# Patient Record
Sex: Female | Born: 1985 | Race: White | Hispanic: No | Marital: Married | Smoking: Former smoker
Health system: Southern US, Community
[De-identification: ages and names within clinical notes are randomized; demographics above are authoritative.]

## PROBLEM LIST (undated history)

## (undated) DIAGNOSIS — R519 Headache, unspecified: Secondary | ICD-10-CM

## (undated) DIAGNOSIS — K900 Celiac disease: Secondary | ICD-10-CM

## (undated) HISTORY — PX: NO PAST SURGERIES: SHX2092

## (undated) HISTORY — DX: Headache, unspecified: R51.9

## (undated) HISTORY — DX: Celiac disease: K90.0

---

## 2020-09-08 ENCOUNTER — Other Ambulatory Visit: Payer: Self-pay | Admitting: Otolaryngology

## 2020-09-08 DIAGNOSIS — J329 Chronic sinusitis, unspecified: Secondary | ICD-10-CM
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
## 2020-11-18 ENCOUNTER — Encounter: Payer: Self-pay | Admitting: Psychiatry

## 2020-11-18 ENCOUNTER — Ambulatory Visit: Payer: BC Managed Care – PPO | Admitting: Psychiatry

## 2020-11-18 VITALS — BP 105/79 | HR 90 | Ht 66.0 in | Wt 130.0 lb

## 2020-11-18 DIAGNOSIS — G08 Intracranial and intraspinal phlebitis and thrombophlebitis: Secondary | ICD-10-CM

## 2020-11-18 MED ORDER — INDOMETHACIN 25 MG PO CAPS: ORAL_CAPSULE | ORAL | 0 refills | Status: AC

## 2020-11-18 NOTE — Patient Instructions (Addendum)
-  MRI/MRA/MRV of brain  -Start Indomethacin for 15 days Week  1:  -Please obtain generic omeprazole (20 mg) [[employer], [employer], [employer], etc] and start one daily. You may increase to twice a day if necessary. -Please start indomethacin (indocin) 25 mg (one capsule) 3 times per day with meals. -If, in one week, you are headache-free, this is your dose, stay on it. -If, in one week, you are tolerating the indocin, but have headache, then increase the dose as below. Week 2: -Please increase indocin to 50 mg (2 capsules) 3 times per day with meals. -If, in one week, you are headache-free, this is your dose, stay on it. -If, in one week, you are tolerating the indocin, but have headache, then increase the dose as below. Week 3:  -Please increase indocin to 75 mg (3 capsules) 3 times per day with meals. -If, in one week, you are headache-free, this is your dose, stay on it. -If, in one week, you have partial relief, call or see her neurologist. -If, in one week, you have no relief, stop the indocin.  Please do not take naproxen while taking indomethacin  Let me know if headaches do not improve after the 15 days of medication

## 2020-11-25 ENCOUNTER — Telehealth: Payer: Self-pay | Admitting: Psychiatry

## 2020-11-25 NOTE — Telephone Encounter (Signed)
The MRV Head & MRA Head is the same CPT code, I will not be able to get both approved. Which one do you prefer her to have?

## 2020-11-25 NOTE — Telephone Encounter (Signed)
aI checked her insurance [employer] and no Berkley Harvey is require, she is able to have all three of the images.  [employer] auth: [employer] spoke to Trish A ref # [id number].

## 2020-11-25 NOTE — Telephone Encounter (Signed)
Let's do the MRA. Thanks!

## 2020-12-05 ENCOUNTER — Ambulatory Visit (HOSPITAL_COMMUNITY)
Admission: RE | Admit: 2020-12-05 | Discharge: 2020-12-05 | Disposition: A | Discharge status: Home / Self Care | Payer: BC Managed Care – PPO | Source: Ambulatory Visit | Attending: Psychiatry | Admitting: Psychiatry

## 2020-12-05 ENCOUNTER — Other Ambulatory Visit: Payer: Self-pay

## 2020-12-05 DIAGNOSIS — G08 Intracranial and intraspinal phlebitis and thrombophlebitis: Secondary | ICD-10-CM | POA: Diagnosis not present
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
## 2020-12-05 MED ORDER — GADOBUTROL 1 MMOL/ML IV SOLN
5.9000 mL | Freq: Once | INTRAVENOUS | Status: AC | PRN
  Administered 2020-12-05: 5.9 mL via INTRAVENOUS

## 2020-12-17 ENCOUNTER — Encounter: Payer: Self-pay | Admitting: Psychiatry

## 2020-12-17 ENCOUNTER — Ambulatory Visit: Payer: BC Managed Care – PPO | Admitting: Psychiatry

## 2020-12-17 VITALS — BP 117/76 | HR 95 | Ht 66.0 in | Wt 131.0 lb

## 2020-12-17 DIAGNOSIS — G43709 Chronic migraine without aura, not intractable, without status migrainosus: Secondary | ICD-10-CM

## 2020-12-17 DIAGNOSIS — M5481 Occipital neuralgia: Secondary | ICD-10-CM | POA: Diagnosis not present

## 2020-12-17 MED ORDER — CYPROHEPTADINE HCL 4 MG PO TABS: 4.0000 mg | ORAL_TABLET | Freq: Every day | ORAL | 2 refills | Status: DC

## 2020-12-17 NOTE — Patient Instructions (Addendum)
Occipital neuralgia and migraine  Start cyproheptadine 4 mg nightly at bedtime to prevent headaches. It can take 6-8 weeks for the medication to build in the system to feel its full effect.  MIGRAINE AND PREGNANCY  Migraine is a complex neurological disorder with many triggers, including sex hormones! This means that some women may experience changes in headache frequency when they are pregnant. In general, the increasing levels of estrogen during pregnancy can be protective, and women may see an improvement in migraine in the 2nd and 3rd trimester. After delivery, women typically return to the pre-pregnancy migraine pattern.   Treatment of migraine in pregnancy is tricky because the list of SAFE medications in pregnancy does not necessarily match the list of EFFECTIVE medications in headache.  The most difficult time is when a woman is planning pregnancy and it is unknown whether she is actually pregnant. Unfortunately, to avoid fetal exposure to potentially harmful medications, it is necessary to stop most migraine preventive medications and avoid the use of typical prescribed and over-the-counter as-needed medications previously used for headache.   ACUTE Treatment of Migraine in Pregnancy - Usual abortive treatments such as triptans and NSAIDS are Category C in pregnancy.  - Acetaminophen (Tylenol) is less commonly used for migraine, however, it is considered the safest analgesic to use in pregnancy (category B), and it can be effective if used early in a migraine, especially in conjunction with caffeine and metoclopramide (reglan), an anti-nausea medication.   PROPHYLACTIC Treatment of Migraine in Pregnancy - Usual prophylactic migraine treatments are not safe to take in pregnancy - Magnesium oxide in doses up to 400mg  per day is often considered first line in pregnancy as a migraine preventative (level B evidence of efficacy) and is thought to be safe. IV Magnesium has been linked to bone  demineralization in the fetus - Cyproheptadine (brand name Periactin) is an older anti-histamine that can be used (max dose 8mg /day) in prophylaxis and has not been shown to have adverse effects to the fetus. Side effects include fatigue and weight gain. This should not be used while breastfeeding.  - Memantine (brand name Namenda) is one of the few preventives not found to cause harm in developing fetuses. It is a drug typically used to treat dementia, but it has also been found to be helpful in some studies of migraine prevention. It works by blocking glutamate, a chemical associated with increased migraine pain and frequency.   Non-pharmacological Methods of Treatment - these are the safest interventions and can significantly redcue the frequency and severity of migraine - Nerve blocks (lidocaine only unless after 20 weeks) - Physical Therapy - Acupuncture - Behavioral Therapies  - Relaxation strategies, stress management, biofeedback, yoga  - Identification of triggers: The major migraine triggers include stress, hormones, diet (missed meals more than specific food triggers), weather, sleep changes, odors, neck pain, lighting/glare, neck pain, exercise/exertion, caffeine intake and dehydration. - Lifestyle changes: eating regular healthy meals, adequate sleep, staying well-hydrated with water (64 oz/day), and at least 30 min of exercise (walking is fine) per day

## 2020-12-17 NOTE — Progress Notes (Signed)
   CC:  Headaches  Follow-up Visit  Last visit: 11/18/20  35 year old female who follows in clinic for daily right sided headaches which began in May 2022. At her last visit she was started on Indomethacin trial and MRI/MRA/MRV was ordered.  Interval History: She tried Indomethacin which did improve her headache frequency but did not resolve the headache. Headache is no longer constant but she continues to get them 3-4 times per week. Sometimes they are severe enough to cause throbbing pain and photophobia. Using a heating pad on her neck  helps reduce the pain.  MRI/MRA/MRV were unremarkable.  She has started trying to become pregnant now that she has stopped Indomethacin.  Endorses crawling sensation in her legs at night. States her iron level was checked earlier this year and was normal.  Headache days per month: 12 Headache free days per month: 18  Current Headache Regimen: Preventative: none Abortive: Tylenol   Prior Therapies                                  Indomethacin 75 mg TID Topamax Excedrin Fioricet   KP review: HEADACHE SCORES:               Physical Exam:   Vital Signs: BP 117/76   Pulse 95   Ht 5\' 6"  (1.676 m)   Wt 131 lb (59.4 kg)   BMI 21.14 kg/m  GENERAL:  well appearing, in no acute distress, alert  HEAD:  Normocephalic/atraumatic. RESP: normal respiratory effort MUSK: +tenderness to palpation over right occiput  NEUROLOGICAL: Mental Status: Alert, oriented to person, place and time, Follows commands, and Speech fluent and appropriate. Cranial Nerves: PERRL, face symmetric, no dysarthria, hearing grossly intact Motor: moves all extremities equally Gait: normal-based.  IMPRESSION: 35 year old female who presents for follow up of right sided headaches. Imaging was normal and headaches did not improve with indomethacin. Her headaches pattern is most consistent with migraine and right occipital neuralgia. Discussed treatment options for  migraine in pregnancy including supplements, medications, and procedures. Will start cyproheptadine 4 mg QHS for headache prevention. Offered antiemetic for rescue, but she would prefer to use Tylenol as monotherapy at this time.  PLAN: -Preventive: Start cyproheptadine 4 mg QHS -Rescue: Tylenol PRN -Next steps: consider occipital nerve block, anti-emetic  Headache education was done. Discussed lifestyle modification including increased oral hydration, decreased caffeine, exercise and stress management. Discussed treatment options including preventive and acute medications, and natural supplements. Discussed medication overuse headache and to limit use of acute treatments to no more than 2 days/week or 10 days/month. Discussed medication side effects, adverse reactions and drug interactions. Written educational materials and patient instructions outlining all of the above were given.   Follow-up: 3 months  I spent a total of 36 minutes on the date of the service.  My impression and recommendations were discussed at length with the patient.  The patient and family voiced understanding to my recommendations.  All questions were answered. The patient was provided with a detailed after visit summary highlighting my impression and recommendations.   34, MD 12/17/20 2:34 PM

## 2021-02-25 ENCOUNTER — Other Ambulatory Visit: Payer: Self-pay | Admitting: Psychiatry

## 2021-02-28 NOTE — L&D Delivery Note (Signed)
Delivery Note At 7:06 PM a viable female was delivered via Vaginal, Spontaneous (Presentation: Left Occiput Anterior).  APGAR: 9, 9; weight  .   Placenta status: Spontaneous, Intact.  Cord: 3 vessels with the following complications: None.  Anesthesia: Epidural Episiotomy: None Lacerations: 3rd degree, 3A, RV exam confirms Suture Repair: 2.0 3.0 vicryl rapide Est. Blood Loss (mL): 240 cc  Mom to postpartum.  Baby to Couplet care / Skin to Skin.  Lyn Henri 10/25/2021, 7:40 PM

## 2021-03-19 LAB — OB RESULTS CONSOLE RUBELLA ANTIBODY, IGM: Rubella: IMMUNE

## 2021-03-19 LAB — OB RESULTS CONSOLE HEPATITIS B SURFACE ANTIGEN: Hepatitis B Surface Ag: NEGATIVE

## 2021-03-19 LAB — OB RESULTS CONSOLE RPR: RPR: NONREACTIVE

## 2021-03-19 LAB — OB RESULTS CONSOLE HIV ANTIBODY (ROUTINE TESTING): HIV: NONREACTIVE

## 2021-03-22 ENCOUNTER — Ambulatory Visit: Payer: BC Managed Care – PPO | Admitting: Psychiatry

## 2021-03-22 ENCOUNTER — Other Ambulatory Visit: Payer: Self-pay

## 2021-03-22 ENCOUNTER — Encounter: Payer: Self-pay | Admitting: Psychiatry

## 2021-03-22 VITALS — BP 105/62 | HR 83 | Ht 66.0 in | Wt 151.0 lb

## 2021-03-22 DIAGNOSIS — G43009 Migraine without aura, not intractable, without status migrainosus: Secondary | ICD-10-CM | POA: Diagnosis not present

## 2021-03-22 NOTE — Progress Notes (Signed)
° °  CC:  headaches  Follow-up Visit  Last visit: 12/17/20  Brief HPI: 36 year old female who follows in clinic for daily right sided headaches which began in May 2022. MRI/MRA/MRV were unremarkable.  Interval History: Since her last visit she has become pregnant. Found out she was pregnant around Christmas.   Headaches have significantly improved since starting cyproheptadine. She will only have headaches if she misses a dose. Has not had to use Tylenol since starting it. Sleep has also improved. Notes she has gained a few pounds, but is not sure if this related to the medication, quitting smoking, or becoming pregnant.   Headache days per month: 0 Headache free days per month: 30  Current Headache Regimen: Preventative: cyproheptadine 4 mg QHS Abortive: Tylenol   Prior Therapies                                  Indomethacin 75 mg TID Topamax Excedrin Fioricet Cyproheptadine 4 mg QHS  Physical Exam:   Vital Signs: BP 105/62    Pulse 83    Ht 5\' 6"  (1.676 m)    Wt 151 lb (68.5 kg)    BMI 24.37 kg/m  GENERAL:  well appearing, in no acute distress, alert  SKIN:  Color, texture, turgor normal. No rashes or lesions HEAD:  Normocephalic/atraumatic. RESP: normal respiratory effort MSK:  No gross joint deformities.   NEUROLOGICAL: Mental Status: Alert, oriented to person, place and time, Follows commands, and Speech fluent and appropriate. Cranial Nerves: PERRL, face symmetric, no dysarthria, hearing grossly intact Motor: moves all extremities equally Gait: normal-based.  IMPRESSION: 36 year old pregnant female who presents for follow up of headaches. Her headaches have essentially resolved since starting cyproheptadine. Counseled that she will need to avoid breastfeeding with this medication, which she is agreeable to. Discussed that weight gain can be a side effects of cyproheptadine, though this is confounded by her quitting smoking and becoming pregnant around the same  time she started the medication. Will continue current medication regimen for now. She will monitor her weight for significant changes.  PLAN: -Preventive: Continue cyproheptadine 4 mg QHS -Rescue: Tylenol PRN -next steps: consider occipital nerve block, anti-emetic   Follow-up: 6 months  I spent a total of 17 minutes on the date of the service. Discussed treatment options including preventive and acute medications, nerve blocks, and physical therapy.  Discussed medication side effects, adverse reactions and drug interactions. Written educational materials and patient instructions outlining all of the above were given.  34, MD 03/22/21 2:53 PM

## 2021-04-07 LAB — OB RESULTS CONSOLE GC/CHLAMYDIA
Chlamydia: NEGATIVE
Neisseria Gonorrhea: NEGATIVE

## 2021-09-16 NOTE — Progress Notes (Signed)
   CC:  headaches  Follow-up Visit  Last visit: 03/22/21  Brief HPI: 36 year old female who follows in clinic for daily right sided headaches which began in May 2022. MRI/MRA/MRV were unremarkable.  At her last visit she was continued on cyproheptadine for migraine prevention and Tylenol for rescue.  Interval History: In May 2023 she developed daily headaches. They were slightly less severe than previous headaches (7/10 vs 10/10). Headaches lasted for three weeks, then resolved. Now her headaches are back to being well-controlled.  1.5 months ago she developed a visual migraine aura which looked like kaleidoscopes in her vision. This was not associated with headaches. Lasted for 30 minutes then resolved. Has had one other visual aura since then.  She is due in 6 weeks.  Current Headache Regimen: Preventative: cyproheptadine 4 mg daily Abortive: Tylenol   Prior Therapies                                  Indomethacin 75 mg TID Topamax Cyproheptadine 4 mg QHS Excedrin Fioricet Tylenol  Physical Exam:   Vital Signs: BP 117/81   Pulse 86   Ht 5\' 6"  (1.676 m)   Wt 194 lb (88 kg)   BMI 31.31 kg/m  GENERAL:  well appearing, in no acute distress, alert  SKIN:  Color, texture, turgor normal. No rashes or lesions HEAD:  Normocephalic/atraumatic. RESP: normal respiratory effort MSK:  No gross joint deformities.   NEUROLOGICAL: Mental Status: Alert, oriented to person, place and time, Follows commands, and Speech fluent and appropriate. Cranial Nerves: PERRL, face symmetric, no dysarthria, hearing grossly intact Motor: moves all extremities equally Gait: normal-based.  IMPRESSION: 36 year old female who is pregnant (due in 6 weeks) presents for follow up of migraines. Her headaches remain well-controlled with low dose cyproheptadine. Discussed risks and benefits of continuing cyproheptadine while breastfeeding. Counseled that this may impair lactation and she should monitor  for infant irritability or drowsiness. She would like to try to continue cyproheptadine for now. Advised that she can start daily magnesium for migraine and aura prevention.   PLAN: -Prevention: Continue cyproheptadine 4 mg daily. Discussed risks and benefits of continuing this while breastfeeding. Start magnesium oxide 400 mg daily -Rescue: Tylenol   Follow-up: 6-8 months  I spent a total of 33 minutes on the date of the service. Headache education was done. Discussed medication side effects, adverse reactions and drug interactions. Written educational materials and patient instructions outlining all of the above were given.  20, MD 09/20/21 4:07 PM

## 2021-09-20 ENCOUNTER — Encounter: Payer: Self-pay | Admitting: Psychiatry

## 2021-09-20 ENCOUNTER — Ambulatory Visit: Payer: BC Managed Care – PPO | Admitting: Psychiatry

## 2021-09-20 VITALS — BP 117/81 | HR 86 | Ht 66.0 in | Wt 194.0 lb

## 2021-09-20 DIAGNOSIS — G43109 Migraine with aura, not intractable, without status migrainosus: Secondary | ICD-10-CM

## 2021-09-20 NOTE — Patient Instructions (Signed)
Start magnesium oxide 400 mg daily for headache and aura prevention

## 2021-10-04 LAB — OB RESULTS CONSOLE GBS: GBS: NEGATIVE

## 2021-10-24 ENCOUNTER — Inpatient Hospital Stay (HOSPITAL_COMMUNITY)
Admission: AD | Admit: 2021-10-24 | Discharge: 2021-10-27 | DRG: 768 | Disposition: A | Discharge status: Home / Self Care | Payer: BC Managed Care – PPO | Attending: Obstetrics and Gynecology | Admitting: Obstetrics and Gynecology

## 2021-10-24 DIAGNOSIS — Z87891 Personal history of nicotine dependence: Secondary | ICD-10-CM

## 2021-10-24 DIAGNOSIS — Z3A39 39 weeks gestation of pregnancy: Secondary | ICD-10-CM

## 2021-10-24 DIAGNOSIS — O4292 Full-term premature rupture of membranes, unspecified as to length of time between rupture and onset of labor: Principal | ICD-10-CM | POA: Diagnosis present

## 2021-10-24 DIAGNOSIS — O9962 Diseases of the digestive system complicating childbirth: Secondary | ICD-10-CM | POA: Diagnosis present

## 2021-10-24 DIAGNOSIS — O134 Gestational [pregnancy-induced] hypertension without significant proteinuria, complicating childbirth: Secondary | ICD-10-CM | POA: Diagnosis present

## 2021-10-24 DIAGNOSIS — O4290 Premature rupture of membranes, unspecified as to length of time between rupture and onset of labor, unspecified weeks of gestation: Principal | ICD-10-CM | POA: Diagnosis present

## 2021-10-24 DIAGNOSIS — K900 Celiac disease: Secondary | ICD-10-CM | POA: Diagnosis present

## 2021-10-24 NOTE — MAU Note (Signed)
..  Erika Davidson is a 36 y.o. at [redacted]w[redacted]d here in MAU reporting: Has been contracting every 5-6 minutes. Report bloody show.  Put a pantyliner on and it is wet but is not sure if she is ruptured or not. Has not had a gush of fluid.  +FM Last SVE was fingertip  Pain score: 6/10 Vitals:   10/24/21 2358  BP: 127/83  Pulse: 89  Resp: 17  Temp: 98.3 F (36.8 C)  SpO2: 100%     FHT:143 Lab orders placed from triage:  labor

## 2021-10-25 ENCOUNTER — Inpatient Hospital Stay (HOSPITAL_COMMUNITY): Payer: BC Managed Care – PPO | Admitting: Anesthesiology

## 2021-10-25 ENCOUNTER — Other Ambulatory Visit: Payer: Self-pay

## 2021-10-25 ENCOUNTER — Encounter (HOSPITAL_COMMUNITY): Payer: Self-pay | Admitting: Obstetrics and Gynecology

## 2021-10-25 DIAGNOSIS — O4292 Full-term premature rupture of membranes, unspecified as to length of time between rupture and onset of labor: Secondary | ICD-10-CM | POA: Diagnosis present

## 2021-10-25 DIAGNOSIS — K900 Celiac disease: Secondary | ICD-10-CM | POA: Diagnosis present

## 2021-10-25 DIAGNOSIS — O9962 Diseases of the digestive system complicating childbirth: Secondary | ICD-10-CM | POA: Diagnosis present

## 2021-10-25 DIAGNOSIS — Z87891 Personal history of nicotine dependence: Secondary | ICD-10-CM | POA: Diagnosis not present

## 2021-10-25 DIAGNOSIS — O26893 Other specified pregnancy related conditions, third trimester: Secondary | ICD-10-CM | POA: Diagnosis present

## 2021-10-25 DIAGNOSIS — Z3A39 39 weeks gestation of pregnancy: Secondary | ICD-10-CM | POA: Diagnosis not present

## 2021-10-25 DIAGNOSIS — O134 Gestational [pregnancy-induced] hypertension without significant proteinuria, complicating childbirth: Secondary | ICD-10-CM | POA: Diagnosis present

## 2021-10-25 DIAGNOSIS — O4290 Premature rupture of membranes, unspecified as to length of time between rupture and onset of labor, unspecified weeks of gestation: Principal | ICD-10-CM | POA: Diagnosis present

## 2021-10-25 LAB — URINALYSIS, ROUTINE W REFLEX MICROSCOPIC
Bilirubin Urine: NEGATIVE
Glucose, UA: NEGATIVE mg/dL
Ketones, ur: 5 mg/dL — AB
Leukocytes,Ua: NEGATIVE
Nitrite: NEGATIVE
Protein, ur: NEGATIVE mg/dL
Specific Gravity, Urine: 1.004 — ABNORMAL LOW (ref 1.005–1.030)
pH: 6 (ref 5.0–8.0)

## 2021-10-25 LAB — CBC
HCT: 40.6 % (ref 36.0–46.0)
Hemoglobin: 13.7 g/dL (ref 12.0–15.0)
MCH: 31.9 pg (ref 26.0–34.0)
MCHC: 33.7 g/dL (ref 30.0–36.0)
MCV: 94.4 fL (ref 80.0–100.0)
Platelets: 248 10*3/uL (ref 150–400)
RBC: 4.3 MIL/uL (ref 3.87–5.11)
RDW: 12.9 % (ref 11.5–15.5)
WBC: 12.6 10*3/uL — ABNORMAL HIGH (ref 4.0–10.5)
nRBC: 0 % (ref 0.0–0.2)

## 2021-10-25 LAB — TYPE AND SCREEN
ABO/RH(D): O POS
Antibody Screen: NEGATIVE

## 2021-10-25 LAB — COMPREHENSIVE METABOLIC PANEL
ALT: 21 U/L (ref 0–44)
AST: 30 U/L (ref 15–41)
Albumin: 3.1 g/dL — ABNORMAL LOW (ref 3.5–5.0)
Alkaline Phosphatase: 209 U/L — ABNORMAL HIGH (ref 38–126)
Anion gap: 11 (ref 5–15)
BUN: 6 mg/dL (ref 6–20)
CO2: 19 mmol/L — ABNORMAL LOW (ref 22–32)
Calcium: 10 mg/dL (ref 8.9–10.3)
Chloride: 105 mmol/L (ref 98–111)
Creatinine, Ser: 0.68 mg/dL (ref 0.44–1.00)
GFR, Estimated: >60 mL/min (ref >60)
Glucose, Bld: 96 mg/dL (ref 70–99)
Potassium: 4.1 mmol/L (ref 3.5–5.1)
Sodium: 135 mmol/L (ref 135–145)
Total Bilirubin: 0.4 mg/dL (ref 0.3–1.2)
Total Protein: 6.9 g/dL (ref 6.5–8.1)

## 2021-10-25 LAB — POCT FERN TEST

## 2021-10-25 LAB — RPR: RPR Ser Ql: NONREACTIVE

## 2021-10-25 LAB — PROTEIN / CREATININE RATIO, URINE
Creatinine, Urine: 32 mg/dL
Protein Creatinine Ratio: 0.25 mg/mg{Cre} — ABNORMAL HIGH (ref 0.00–0.15)
Total Protein, Urine: 8 mg/dL

## 2021-10-25 MED ORDER — ACETAMINOPHEN 325 MG PO TABS: 650.0000 mg | ORAL_TABLET | ORAL | Status: DC | PRN

## 2021-10-25 MED ORDER — OXYCODONE-ACETAMINOPHEN 5-325 MG PO TABS: 2.0000 | ORAL_TABLET | ORAL | Status: DC | PRN

## 2021-10-25 MED ORDER — IBUPROFEN 600 MG PO TABS
600.0000 mg | ORAL_TABLET | Freq: Four times a day (QID) | ORAL | Status: DC
  Administered 2021-10-25 – 2021-10-27 (×7): 600 mg via ORAL
  Filled 2021-10-25 (×8): qty 1

## 2021-10-25 MED ORDER — ONDANSETRON HCL 4 MG/2ML IJ SOLN: 4.0000 mg | INTRAMUSCULAR | Status: DC | PRN

## 2021-10-25 MED ORDER — ZOLPIDEM TARTRATE 5 MG PO TABS: 5.0000 mg | ORAL_TABLET | Freq: Every evening | ORAL | Status: DC | PRN

## 2021-10-25 MED ORDER — ONDANSETRON HCL 4 MG PO TABS: 4.0000 mg | ORAL_TABLET | ORAL | Status: DC | PRN

## 2021-10-25 MED ORDER — OXYTOCIN-SODIUM CHLORIDE 30-0.9 UT/500ML-% IV SOLN
2.5000 [IU]/h | INTRAVENOUS | Status: DC
  Administered 2021-10-25: 2.5 [IU]/h via INTRAVENOUS
  Filled 2021-10-25: qty 500

## 2021-10-25 MED ORDER — FENTANYL-BUPIVACAINE-NACL 0.5-0.125-0.9 MG/250ML-% EP SOLN
12.0000 mL/h | EPIDURAL | Status: DC | PRN
  Administered 2021-10-25: 12 mL/h via EPIDURAL
  Filled 2021-10-25: qty 250

## 2021-10-25 MED ORDER — ONDANSETRON HCL 4 MG/2ML IJ SOLN: 4.0000 mg | Freq: Four times a day (QID) | INTRAMUSCULAR | Status: DC | PRN

## 2021-10-25 MED ORDER — FENTANYL CITRATE (PF) 100 MCG/2ML IJ SOLN: 50.0000 ug | INTRAMUSCULAR | Status: DC | PRN

## 2021-10-25 MED ORDER — TERBUTALINE SULFATE 1 MG/ML IJ SOLN: 0.2500 mg | Freq: Once | INTRAMUSCULAR | Status: DC | PRN

## 2021-10-25 MED ORDER — SOD CITRATE-CITRIC ACID 500-334 MG/5ML PO SOLN: 30.0000 mL | ORAL | Status: DC | PRN

## 2021-10-25 MED ORDER — DIPHENHYDRAMINE HCL 50 MG/ML IJ SOLN
12.5000 mg | INTRAMUSCULAR | Status: DC | PRN
  Administered 2021-10-25: 12.5 mg via INTRAVENOUS
  Filled 2021-10-25: qty 1

## 2021-10-25 MED ORDER — DIPHENHYDRAMINE HCL 50 MG/ML IJ SOLN: 12.5000 mg | INTRAMUSCULAR | Status: DC | PRN

## 2021-10-25 MED ORDER — FLEET ENEMA 7-19 GM/118ML RE ENEM: 1.0000 | ENEMA | RECTAL | Status: DC | PRN

## 2021-10-25 MED ORDER — LACTATED RINGERS IV SOLN
500.0000 mL | INTRAVENOUS | Status: DC | PRN
Start: 2021-10-25 — End: 2021-10-25

## 2021-10-25 MED ORDER — OXYTOCIN BOLUS FROM INFUSION
333.0000 mL | Freq: Once | INTRAVENOUS | Status: AC
Start: 2021-10-25 — End: 2021-10-25
  Administered 2021-10-25: 333 mL via INTRAVENOUS

## 2021-10-25 MED ORDER — EPHEDRINE 5 MG/ML INJ: 10.0000 mg | INTRAVENOUS | Status: DC | PRN

## 2021-10-25 MED ORDER — COCONUT OIL OIL: 1.0000 | TOPICAL_OIL | Status: DC | PRN

## 2021-10-25 MED ORDER — BENZOCAINE-MENTHOL 20-0.5 % EX AERO
1.0000 | INHALATION_SPRAY | CUTANEOUS | Status: DC | PRN
  Filled 2021-10-25: qty 56

## 2021-10-25 MED ORDER — LACTATED RINGERS IV SOLN
500.0000 mL | Freq: Once | INTRAVENOUS | Status: AC
  Administered 2021-10-25: 500 mL via INTRAVENOUS

## 2021-10-25 MED ORDER — SENNOSIDES-DOCUSATE SODIUM 8.6-50 MG PO TABS
2.0000 | ORAL_TABLET | ORAL | Status: DC
  Administered 2021-10-25 – 2021-10-26 (×2): 2 via ORAL
  Filled 2021-10-25 (×3): qty 2

## 2021-10-25 MED ORDER — FENTANYL-BUPIVACAINE-NACL 0.5-0.125-0.9 MG/250ML-% EP SOLN: 12.0000 mL/h | EPIDURAL | Status: DC | PRN

## 2021-10-25 MED ORDER — TETANUS-DIPHTH-ACELL PERTUSSIS 5-2.5-18.5 LF-MCG/0.5 IM SUSY: 0.5000 mL | PREFILLED_SYRINGE | Freq: Once | INTRAMUSCULAR | Status: DC

## 2021-10-25 MED ORDER — LIDOCAINE HCL (PF) 1 % IJ SOLN: 30.0000 mL | INTRAMUSCULAR | Status: DC | PRN

## 2021-10-25 MED ORDER — LACTATED RINGERS IV SOLN: INTRAVENOUS | Status: DC

## 2021-10-25 MED ORDER — DIPHENHYDRAMINE HCL 25 MG PO CAPS: 25.0000 mg | ORAL_CAPSULE | Freq: Four times a day (QID) | ORAL | Status: DC | PRN

## 2021-10-25 MED ORDER — PHENYLEPHRINE 80 MCG/ML (10ML) SYRINGE FOR IV PUSH (FOR BLOOD PRESSURE SUPPORT): 80.0000 ug | PREFILLED_SYRINGE | INTRAVENOUS | Status: DC | PRN

## 2021-10-25 MED ORDER — DIBUCAINE (PERIANAL) 1 % EX OINT: 1.0000 | TOPICAL_OINTMENT | CUTANEOUS | Status: DC | PRN

## 2021-10-25 MED ORDER — OXYCODONE-ACETAMINOPHEN 5-325 MG PO TABS: 1.0000 | ORAL_TABLET | ORAL | Status: DC | PRN

## 2021-10-25 MED ORDER — OXYTOCIN-SODIUM CHLORIDE 30-0.9 UT/500ML-% IV SOLN
1.0000 m[IU]/min | INTRAVENOUS | Status: DC
  Administered 2021-10-25: 2 m[IU]/min via INTRAVENOUS

## 2021-10-25 MED ORDER — PHENYLEPHRINE 80 MCG/ML (10ML) SYRINGE FOR IV PUSH (FOR BLOOD PRESSURE SUPPORT)
80.0000 ug | PREFILLED_SYRINGE | INTRAVENOUS | Status: DC | PRN
  Filled 2021-10-25: qty 10

## 2021-10-25 MED ORDER — WITCH HAZEL-GLYCERIN EX PADS: 1.0000 | MEDICATED_PAD | CUTANEOUS | Status: DC | PRN

## 2021-10-25 MED ORDER — CYPROHEPTADINE HCL 4 MG PO TABS
4.0000 mg | ORAL_TABLET | Freq: Every day | ORAL | Status: DC
  Administered 2021-10-25 – 2021-10-26 (×2): 4 mg via ORAL
  Filled 2021-10-25 (×4): qty 1

## 2021-10-25 MED ORDER — SIMETHICONE 80 MG PO CHEW: 80.0000 mg | CHEWABLE_TABLET | ORAL | Status: DC | PRN

## 2021-10-25 MED ORDER — LIDOCAINE HCL (PF) 1 % IJ SOLN
INTRAMUSCULAR | Status: DC | PRN
  Administered 2021-10-25: 10 mL via EPIDURAL

## 2021-10-25 MED ORDER — PRENATAL MULTIVITAMIN CH
1.0000 | ORAL_TABLET | Freq: Every day | ORAL | Status: DC
  Administered 2021-10-26 – 2021-10-27 (×2): 1 via ORAL
  Filled 2021-10-25 (×2): qty 1

## 2021-10-25 NOTE — H&P (Signed)
Erika Davidson is a 36 y.o. female presenting for UCs and possibly SROM. Pregnancy complicated by celiac disease, livedo reticularis and Hx of migraine. OB History     Gravida  1   Para      Term      Preterm      AB      Living         SAB      IAB      Ectopic      Multiple      Live Births             Past Medical History:  Diagnosis Date   Celiac disease    Headache    Past Surgical History:  Procedure Laterality Date   NO PAST SURGERIES     Family History: family history is not on file. Social History:  reports that she has quit smoking. Her smoking use included e-cigarettes. She does not have any smokeless tobacco history on file. She reports that she does not currently use alcohol. She reports that she does not use drugs.     Maternal Diabetes: No Genetic Screening: Normal Maternal Ultrasounds/Referrals: Normal Fetal Ultrasounds or other Referrals:  None Maternal Substance Abuse:  No Significant Maternal Medications:  Meds include: Other: cyproheptadine Significant Maternal Lab Results:  Group B Strep negative Number of Prenatal Visits:greater than 3 verified prenatal visits Other Comments:  None  Review of Systems Maternal Medical History:  Reason for admission: Rupture of membranes.   Fetal activity: Perceived fetal activity is normal.     Dilation: 2 Effacement (%): 100 Station: -2 Exam by:: Ginnie Smart RN Blood pressure (!) 138/96, pulse 73, temperature 98.3 F (36.8 C), temperature source Oral, resp. rate 17, height 5\' 6"  (1.676 m), weight 90.5 kg, SpO2 98 %. Maternal Exam:  Abdomen: Fetal presentation: vertex   Fetal Exam Fetal State Assessment: Category I - tracings are normal.   Physical Exam Cardiovascular:     Rate and Rhythm: Normal rate.  Pulmonary:     Effort: Pulmonary effort is normal.     Prenatal labs: ABO, Rh:   Antibody:   Rubella:   RPR:    HBsAg:    HIV:    GBS:   negative  10/04/21  Assessment/Plan: 36 yo G1P0 @ 39 2/7 wks entering active labor She has mildly elevated BP>will check CMET If CNS sxs or severe range BP develop will start magnesium sulfate Admit to L&D   4/7 II 10/25/2021, 1:15 AM

## 2021-10-25 NOTE — Progress Notes (Signed)
Labor Progress Note  Patient doing well. FHT cat 1 with accels PIH labs WNL Epidural in place  Cervix unchanged at 2/70/-2, will start pitocin 2x2.  Continue current plan, anticipate vaginal delivery.  Nilda Simmer MD

## 2021-10-25 NOTE — Anesthesia Preprocedure Evaluation (Signed)
Anesthesia Evaluation  Patient identified by MRN, date of birth, ID band Patient awake    Reviewed: Allergy & Precautions, H&P , NPO status , Patient's Chart, lab work & pertinent test results, reviewed documented beta blocker date and time   Airway Mallampati: II  TM Distance: >3 FB Neck ROM: full    Dental no notable dental hx.    Pulmonary neg pulmonary ROS, former smoker,    Pulmonary exam normal breath sounds clear to auscultation       Cardiovascular negative cardio ROS Normal cardiovascular exam Rhythm:regular Rate:Normal     Neuro/Psych negative neurological ROS  negative psych ROS   GI/Hepatic negative GI ROS, Neg liver ROS,   Endo/Other  negative endocrine ROS  Renal/GU negative Renal ROS  negative genitourinary   Musculoskeletal   Abdominal   Peds  Hematology negative hematology ROS (+)   Anesthesia Other Findings   Reproductive/Obstetrics (+) Pregnancy                             Anesthesia Physical Anesthesia Plan  ASA: 2  Anesthesia Plan: Epidural   Post-op Pain Management: Minimal or no pain anticipated   Induction:   PONV Risk Score and Plan: 2  Airway Management Planned: Natural Airway  Additional Equipment: None  Intra-op Plan:   Post-operative Plan:   Informed Consent: I have reviewed the patients History and Physical, chart, labs and discussed the procedure including the risks, benefits and alternatives for the proposed anesthesia with the patient or authorized representative who has indicated his/her understanding and acceptance.       Plan Discussed with: Anesthesiologist  Anesthesia Plan Comments:         Anesthesia Quick Evaluation

## 2021-10-25 NOTE — Anesthesia Procedure Notes (Signed)
Epidural Patient location during procedure: OB Start time: 10/25/2021 3:07 AM End time: 10/25/2021 3:10 AM  Staffing Anesthesiologist: Bethena Midget, MD  Preanesthetic Checklist Completed: patient identified, IV checked, site marked, risks and benefits discussed, surgical consent, monitors and equipment checked, pre-op evaluation and timeout performed  Epidural Patient position: sitting Prep: DuraPrep and site prepped and draped Patient monitoring: continuous pulse ox and blood pressure Approach: midline Location: L3-L4 Injection technique: LOR air  Needle:  Needle type: Tuohy  Needle gauge: 17 G Needle length: 9 cm and 9 Needle insertion depth: 6 cm Catheter type: closed end flexible Catheter size: 19 Gauge Catheter at skin depth: 12 cm Test dose: negative  Assessment Events: blood not aspirated, injection not painful, no injection resistance, no paresthesia and negative IV test

## 2021-10-25 NOTE — Lactation Note (Signed)
This note was copied from a baby's chart. Lactation Consultation Note  Patient Name: Erika Davidson [id number] Date: 10/25/2021 Reason for consult: L&D Initial assessment;Primapara;Term Age:34 hours  Holding baby STS. Assisted in football hold to get deeper latch. LC needed to use t-cup hold at first to hold breast tissue into baby's mouth until breast softened some and baby could hold tissue in her mouth herself. Baby BF well. It was a little tender at first for mom. LC kept flanging  lip. They looked good. Mom stated that she has tender nipple anyway. Mom stated she had bought a NS but didn't bring it. Noted softening and praised mom for that. Mom stated she is taking a medication that lowers milk supply. LC will look it up. Mom is fine with having to supplement. LC hand expressed a dot of colostrum. Mom was happy about that. Will f/u on MBU.   Maternal Data Does the patient have breastfeeding experience prior to this delivery?: No  Feeding    LATCH Score Latch: Grasps breast easily, tongue down, lips flanged, rhythmical sucking.  Audible Swallowing: A few with stimulation  Type of Nipple: Flat  Comfort (Breast/Nipple): Filling, red/small blisters or bruises, mild/mod discomfort (breast feel heavy and breast and nipples are tender)  Hold (Positioning): Assistance needed to correctly position infant at breast and maintain latch.  LATCH Score: 6   Lactation Tools Discussed/Used    Interventions Interventions: Adjust position;Assisted with latch;Support pillows;Skin to skin;Position options;Breast compression  Discharge    Consult Status Consult Status: Follow-up Date: 10/26/21 Follow-up type: In-patient    Charyl Dancer 10/25/2021, 8:47 PM

## 2021-10-26 NOTE — Lactation Note (Signed)
This note was copied from a baby's chart. Lactation Consultation Note  Patient Name: Erika Davidson [id number] Date: 10/26/2021   Age:34 hours  LC attempted to visit with the birth parent, but the RN was in the room.   Maternal Data    Feeding    LATCH Score                    Lactation Tools Discussed/Used Tools: Nipple Shields Nipple shield size: 20  Interventions    Discharge    Consult Status      Erika Davidson 10/26/2021, 9:20 PM

## 2021-10-26 NOTE — Anesthesia Postprocedure Evaluation (Signed)
Anesthesia Post Note  Patient: Armed forces training and education officer  Procedure(s) Performed: AN AD HOC LABOR EPIDURAL     Patient location during evaluation: Mother Baby Anesthesia Type: Epidural Level of consciousness: awake and alert Pain management: pain level controlled Vital Signs Assessment: post-procedure vital signs reviewed and stable Respiratory status: spontaneous breathing, nonlabored ventilation and respiratory function stable Cardiovascular status: stable Postop Assessment: no headache, no backache and epidural receding Anesthetic complications: no   No notable events documented.  Last Vitals:  Vitals:   10/26/21 0500 10/26/21 1301  BP: 133/78 119/75  Pulse: 77 78  Resp: 18   Temp: 36.9 C 36.9 C  SpO2: 100%     Last Pain:  Vitals:   10/26/21 1301  TempSrc: Oral  PainSc: 0-No pain   Pain Goal: Patients Stated Pain Goal: 0 (10/24/21 2359)                 Jennye Moccasin, [place] Picking

## 2021-10-26 NOTE — Social Work (Signed)
CSW received consult for hx of Anxiety and Depression.  CSW met with MOB to offer support and complete assessment. CSW entered the room, introduced self, CSW role and reason for visit. MOB was agree abel to visit. CSW observed MOB's MIL in the room holding the infant and Mob lying in bed. MOB granted verbal permission to speak in front of her MIL. CSW inquired abut how MOB was feeling. MOB reported she was great and she could not have asked for a better experience. CSW congratulated MOB. CSW inquired about MOB MH hx. MOB stated she was diagnosed with seasonal depression "a couple years ago". MOB explained she does have period where she is depressed for a few days but she only allows herself 3 days. MOB referred to it as her "3 step program" to allow herself to feel the emotion but not get stuck there. MOB reported that this method has been working for about 2 1/2 years. MOB reported she was seeing Therapist in [place] but she has not been engaged in therapist since moving here. MOB reported a stable mood and has no concerns at this time. MOB reported she feels comfortable reaching out to her OBGYN if changes in her mood occur. CSW assessed for safety, MOB denied any SI or HI. MOB identified her spouse (FOB) and best friend as her supports. CSW provided education regarding the baby blues period vs. perinatal mood disorders, discussed treatment and gave resources for mental health follow up if concerns arise.  CSW recommends self-evaluation during the postpartum time period using the New Mom Checklist from Postpartum Progress and encouraged MOB to contact a medical professional if symptoms are noted at any time.    CSW provided review of Sudden Infant Death Syndrome (SIDS) precautions. MOB identified [employer] for infant. follow up care. MOB reported they have all necessary items for the infant including a bassinet for her to sleep.     CSW identifies no further need for intervention and no barriers to  discharge at this time.   Erika Davidson, [place] Social Worker [phone]

## 2021-10-26 NOTE — Progress Notes (Signed)
Post Partum Day 1 Subjective: no complaints, up ad lib, voiding, and tolerating PO  Objective: Blood pressure 133/78, pulse 77, temperature 98.5 F (36.9 C), temperature source Oral, resp. rate 18, height 5\' 6"  (1.676 m), weight 90.5 kg, SpO2 100 %, unknown if currently breastfeeding.  Physical Exam:  General: alert, cooperative, appears stated age, and no distress Lochia: appropriate Uterine Fundus: firm Incision:   DVT Evaluation: No evidence of DVT seen on physical exam.  Recent Labs    10/25/21 0132 10/26/21 0459  HGB 13.7 10.6*  HCT 40.6 31.6*    Assessment/Plan: Plan for discharge tomorrow and Breastfeeding   LOS: 1 day   10/28/21, MD 10/26/2021, 11:31 AM

## 2021-10-26 NOTE — Lactation Note (Signed)
This note was copied from a baby's chart. Lactation Consultation Note  Patient Name: Erika Davidson [id number] Date: 10/26/2021 Reason for consult: Mother's request;Primapara;Term Age:34 hours Mom called for latch assistance. RN did help BF parent w/latching.  Baby was awake and mom holding baby when [facility] came. Baby was cueing some so LC attempted to latch. Baby gagging trying to spit up but nothing coming out. Swaddled baby. BF parent asked if LC could place baby in bassinet. LC did so. Mom demonstrated application of NS. Praised mom. Mom will call for next feeding.   Maternal Data Has patient been taught Hand Expression?: Yes Does the patient have breastfeeding experience prior to this delivery?: No  Feeding    LATCH Score Latch: Too sleepy or reluctant, no latch achieved, no sucking elicited.  Audible Swallowing: None  Type of Nipple: Flat  Comfort (Breast/Nipple): Soft / non-tender  Hold (Positioning): Full assist, staff holds infant at breast  LATCH Score: 3   Lactation Tools Discussed/Used Tools: Nipple Shields Nipple shield size: 20 Breast pump type: Double-Electric Breast Pump Pump Education: Setup, frequency, and cleaning Reason for Pumping: flat/NS Pumping frequency: q3hr  Interventions Interventions: Breast feeding basics reviewed;Assisted with latch;Skin to skin;Breast massage;Hand express;Support pillows;Adjust position;Position options  Discharge    Consult Status Consult Status: Follow-up Date: 10/26/21 Follow-up type: In-patient    [name], [name] 10/26/2021, 4:16 AM

## 2021-10-26 NOTE — Lactation Note (Signed)
This note was copied from a baby's chart. Lactation Consultation Note  Patient Name: Erika Davidson Date: 10/26/2021 Reason for consult: Initial assessment;Primapara;Term Age:35 hours Baby still gagging at intervals and trying to spit. Dad stated that he cleared the baby's mouth of mucous w/bulb syring. Baby not interested in BF at this time. LC asked  dad to hold the baby upright for a little bit then lay her down. LC set up DEBP. Mom very tired would like to pump tomorrow. LC told mom OK. Fitted mom w/#20/also have #24 will see which feels the best when baby feeds. LC will go for next feeding to assist. Gave mom shells to wear in am. Newborn feeding habits, behavior, STS, I&O reviewed. Encouraged to call for latch assistance for next feeding.  Maternal Data Has patient been taught Hand Expression?: Yes Does the patient have breastfeeding experience prior to this delivery?: No  Feeding    LATCH Score Latch: Too sleepy or reluctant, no latch achieved, no sucking elicited.  Audible Swallowing: None  Type of Nipple: Flat  Comfort (Breast/Nipple): Filling, red/small blisters or bruises, mild/mod discomfort (heavy breast)  Hold (Positioning): Full assist, staff holds infant at breast  LATCH Score: 2   Lactation Tools Discussed/Used Tools: Shells;Pump Breast pump type: Double-Electric Breast Pump Pump Education: Setup, frequency, and cleaning Reason for Pumping: flat/NS Pumping frequency: q3hr  Interventions Interventions: Breast feeding basics reviewed;Assisted with latch;Skin to skin;Breast massage;Hand express;Pre-pump if needed;Breast compression;Adjust position;Support pillows;Position options;Shells;DEBP;LC Services brochure  Discharge    Consult Status Consult Status: Follow-up Date: 10/26/21 Follow-up type: In-patient    Kala Ambriz, [name] 10/26/2021, 1:33 AM

## 2021-10-27 LAB — CBC
HCT: 31.6 % — ABNORMAL LOW (ref 36.0–46.0)
Hemoglobin: 10.6 g/dL — ABNORMAL LOW (ref 12.0–15.0)
MCH: 31.3 pg (ref 26.0–34.0)
MCHC: 33.5 g/dL (ref 30.0–36.0)
MCV: 93.2 fL (ref 80.0–100.0)
Platelets: 200 10*3/uL (ref 150–400)
RBC: 3.39 MIL/uL — ABNORMAL LOW (ref 3.87–5.11)
RDW: 12.8 % (ref 11.5–15.5)
WBC: 14.7 10*3/uL — ABNORMAL HIGH (ref 4.0–10.5)
nRBC: 0 % (ref 0.0–0.2)

## 2021-10-27 MED ORDER — IBUPROFEN 600 MG PO TABS: 600.0000 mg | ORAL_TABLET | Freq: Four times a day (QID) | ORAL | 0 refills | Status: DC

## 2021-10-27 NOTE — Lactation Note (Signed)
This note was copied from a baby's chart. Lactation Consultation Note  Patient Name: Erika Davidson [id number] Date: 10/27/2021 Reason for consult: Follow-up assessment;Primapara;1st time breastfeeding;Term;Nipple pain/trauma;Infant weight loss (6 % weight loss, due to sore nipples bilaterally, mom pumping and bottle feeding.) Age:34 hours Birth parent  on Periactin daily due to the tx of migraine , has been taking the med daily all during her pregnancy, ok from her OB and neurologist MD's to breast feeding. According to Bobbye Morton - Medications - L3 .  Birth parent aware may cause sedation in the baby.  Birth parent mentioned the baby has been waking on her own at 2 - 3 hours.  LC reviewed steps for latching when her sore nipples heal and how to transition back to the breast and how to establish and protect her milk supply.  Aware of LC resources after D/C and if she is still having to use a NS to call for LC O/P.  Maternal Data    Feeding Mother's Current Feeding Choice: Breast Milk and Formula Nipple Type: Extra Slow Flow  LATCH Score    Lactation Tools Discussed/Used Tools: Shells;Pump;Nipple Shields Nipple shield size: 20 Breast pump type: Double-Electric Breast Pump;Manual  Interventions Interventions: Breast feeding basics reviewed;DEBP;Shells;Comfort gels;Education;LC Services brochure  Discharge Discharge Education: Engorgement and breast care;Warning signs for feeding baby;Outpatient recommendation;Other (comment) (if the sore nipples are clearing up or having to use the Nipple shield again.) Pump: DEBP;Personal  Consult Status Consult Status: Complete Date: 10/27/21    Kathrin Greathouse 10/27/2021, 11:23 AM

## 2021-10-27 NOTE — Discharge Summary (Signed)
Postpartum Discharge Summary  Date of Service October 27, 2021      Patient Name: Erika Davidson DOB: 11/13/1985 MRN: [id number]  Date of admission: 10/24/2021 Delivery date:10/25/2021  Delivering provider: Eyvonne Mechanic A  Date of discharge: 10/27/2021  Admitting diagnosis: PROM (premature rupture of membranes) [O42.90] Intrauterine pregnancy: [redacted]w[redacted]d    Secondary diagnosis:  Principal Problem:   PROM (premature rupture of membranes)  Additional problems: none    Discharge diagnosis: Term Pregnancy Delivered and Gestational Hypertension                                              Post partum procedures: none Augmentation: AROM, Pitocin, and Cytotec Complications: None  Hospital course: Induction of Labor With Vaginal Delivery   36y.o. yo G1P1001 at 347w2das admitted to the hospital 10/24/2021 for induction of labor.  Indication for induction: Gestational hypertension.  Patient had an uncomplicated labor course as follows: Membrane Rupture Time/Date: 10:30 PM ,10/25/2021   Delivery Method:Vaginal, Spontaneous  Episiotomy: None  Lacerations:  2nd degree  Details of delivery can be found in separate delivery note.  Patient had a routine postpartum course. Patient is discharged home 10/27/21.  Newborn Data: Birth date:10/25/2021  Birth time:7:06 PM  Gender:Female  Living status:Living  Apgars:9 ,9  Weight:3340 g   Magnesium Sulfate received: No BMZ received: No Rhophylac:N/A MMR:N/A T-DaP:Given prenatally Flu: N/A Transfusion:No  Physical exam  Vitals:   10/26/21 0000 10/26/21 0500 10/26/21 1301 10/27/21 0500  BP: 111/83 133/78 119/75 119/87  Pulse: 77 77 78 88  Resp: _0 Temp: 98.4 F (36.9 C) 98.5 F (36.9 C) 98.4 F (36.9 C) 98.2 F (36.8 C)  TempSrc: Oral Oral Oral Oral  SpO2: 99% 100%  100%  Weight:      Height:       General: alert, cooperative, and no distress Lochia: appropriate Uterine Fundus: firm Incision: Healing well with no  significant drainage DVT Evaluation: No evidence of DVT seen on physical exam. Labs: Lab Results  Component Value Date   WBC 14.7 (H) 10/26/2021   HGB 10.6 (L) 10/26/2021   HCT 31.6 (L) 10/26/2021   MCV 93.2 10/26/2021   PLT 200 10/26/2021      Latest Ref Rng & Units 10/25/2021    1:32 AM  CMP  Glucose 70 - 99 mg/dL 96   BUN 6 - 20 mg/dL 6   Creatinine 0.44 - 1.00 mg/dL 0.68   Sodium 135 - 145 mmol/L 135   Potassium 3.5 - 5.1 mmol/L 4.1   Chloride 98 - 111 mmol/L 105   CO2 22 - 32 mmol/L 19   Calcium 8.9 - 10.3 mg/dL 10.0   Total Protein 6.5 - 8.1 g/dL 6.9   Total Bilirubin 0.3 - 1.2 mg/dL 0.4   Alkaline Phos 38 - 126 U/L 209   AST 15 - 41 U/L 30   ALT 0 - 44 U/L 21    Edinburgh Score:    10/25/2021    9:15 PM  Edinburgh Postnatal Depression Scale Screening Tool  I have been able to laugh and see the funny side of things. 0  I have looked forward with enjoyment to things. 0  I have blamed myself unnecessarily when things went wrong. 1  I have been anxious or worried for no good reason. 0  I have  felt scared or panicky for no good reason. 0  Things have been getting on top of me. 1  I have been so unhappy that I have had difficulty sleeping. 0  I have felt sad or miserable. 0  I have been so unhappy that I have been crying. 0  The thought of harming myself has occurred to me. 0  Edinburgh Postnatal Depression Scale Total 2      After visit meds:  Allergies as of 10/27/2021       Reactions   Wheat Bran Diarrhea, Nausea And Vomiting        Medication List     STOP taking these medications    cyproheptadine 4 MG tablet Commonly known as: PERIACTIN   indomethacin 25 MG capsule Commonly known as: INDOCIN       TAKE these medications    multivitamin-prenatal 27-0.8 MG Tabs tablet Take 1 tablet by mouth daily at 12 noon.         Discharge home in stable condition Infant Feeding: Breast Infant Disposition:home with mother Discharge instruction:  per After Visit Summary and Postpartum booklet. Activity: Advance as tolerated. Pelvic rest for 6 weeks.  Diet: routine diet Anticipated Birth Control: Unsure Postpartum Appointment:6 weeks Additional Postpartum F/U:  none Future Appointments: Future Appointments  Date Time Provider [place]  04/25/2022  2:30 PM Genia Harold, MD [facility] None   Follow up Visit:      10/27/2021 Cyril Mourning, MD

## 2021-11-02 ENCOUNTER — Telehealth (HOSPITAL_COMMUNITY): Payer: Self-pay | Admitting: *Deleted

## 2021-11-02 NOTE — Telephone Encounter (Signed)
Mom reports feeling good. No concerns about herself at this time. EPDS=4 [facility] score=2) Mom reports baby is doing well. Feeding, peeing, and pooping without difficulty. Safe sleep reviewed. Mom reports no concerns about baby at present.  Duffy Rhody, RN 11-02-2021 at 3:53pm

## 2021-11-02 NOTE — Telephone Encounter (Signed)
Left phone voicemail message.  Duffy Rhody, RN 11-02-2021 at 12:30pm

## 2021-11-13 ENCOUNTER — Other Ambulatory Visit: Payer: Self-pay | Admitting: Psychiatry

## 2021-11-17 ENCOUNTER — Inpatient Hospital Stay (HOSPITAL_COMMUNITY): Admit: 2021-11-17 | Payer: Self-pay

## 2021-11-17 NOTE — Telephone Encounter (Signed)
Pt called needing a refill on her Cyproheptadine 4 mg QHS sent in to the [facility] in [place] Pt would also like if the provider can check why her Cyproheptadine 4 mg QHS does not show on her Medication list. Please advise.

## 2021-11-18 ENCOUNTER — Other Ambulatory Visit: Payer: Self-pay

## 2021-11-18 NOTE — Telephone Encounter (Signed)
Pt called again and we were able to figure out that her OBGYN had discontinued it when she was delivering her baby a couple of weeks ago. Pt states that she is now able to continue to take her medication but is not able to put in the request due to OBGYN taking it off in her mychart and discontinuing.  Pt is in need of a refill. Please advise

## 2022-04-13 NOTE — Progress Notes (Unsigned)
   CC:  headaches  Follow-up Visit  Last visit: 09/20/21  Brief HPI: 36 year old female with a history of hypothyroidism who follows in clinic for daily right sided headaches which began in May 2022. MRI/MRA/MRV were unremarkable.   At her last visit she was continued on cyproheptadine 4 mg daily. She was started on magnesium oxide 400 mg daily for migraine prevention.  Interval History: Headaches continue to be well-controlled on cyproheptadine. She does notice a worsening of headaches if she misses a dose of her medication. She is not currently breastfeeding. She continues to get 1 mild headache per month, but her last migraine was 1.5 years ago. Mild headaches generally resolve with Tylenol. Notes that she was just diagnosed with hypothyroidism and was started on Synthroid yesterday.   Headache days per month: 1 Migraine days per month: 0 Headache free days per month: 29  Current Headache Regimen: Preventative: cyproheptadine 4 mg QHS Abortive: Tylenol   Prior Therapies      Prevention:                             Indomethacin 75 mg TID Topamax Cyproheptadine 4 mg QHS  Rescue: Excedrin Fioricet Tylenol  Physical Exam:   Vital Signs: BP 124/86   Pulse 72   Wt 170 lb 8 oz (77.3 kg)   BMI 27.52 kg/m  GENERAL:  well appearing, in no acute distress, alert  SKIN:  Color, texture, turgor normal. No rashes or lesions HEAD:  Normocephalic/atraumatic. RESP: normal respiratory effort MSK:  No gross joint deformities.   NEUROLOGICAL: Mental Status: Alert, oriented to person, place and time, Follows commands, and Speech fluent and appropriate. Cranial Nerves: PERRL, face symmetric, no dysarthria, hearing grossly intact Motor: moves all extremities equally Gait: normal-based.  IMPRESSION: 37 year old female with a history of hypothyroidism who presents for follow up of migraines. Her headaches remain well-controlled on cyproheptadine. Had a slight increase in mild  headaches recently, which may be due to her recently diagnosed hypothyroidism. Will monitor for improvement as she just started Synthroid, and will continue current regimen for now.  PLAN: -Prevention: Continue cyproheptadine 4 mg QHS -Rescue: Continue Tylenol PRN   Follow-up: 1 year, or sooner if needed  I spent a total of 20 minutes on the date of the service. Headache education was done. Discussed medication side effects, adverse reactions and drug interactions. Written educational materials and patient instructions outlining all of the above were given.  Genia Harold, MD 04/14/22 9:13 AM

## 2022-04-14 ENCOUNTER — Encounter: Payer: Self-pay | Admitting: Psychiatry

## 2022-04-14 ENCOUNTER — Ambulatory Visit: Payer: BC Managed Care – PPO | Admitting: Psychiatry

## 2022-04-14 VITALS — BP 124/86 | HR 72 | Wt 170.5 lb

## 2022-04-14 DIAGNOSIS — G43109 Migraine with aura, not intractable, without status migrainosus: Secondary | ICD-10-CM | POA: Diagnosis not present

## 2022-04-25 ENCOUNTER — Ambulatory Visit: Payer: BC Managed Care – PPO | Admitting: Psychiatry

## 2022-08-02 ENCOUNTER — Encounter: Payer: Self-pay | Admitting: Psychiatry

## 2022-08-09 ENCOUNTER — Ambulatory Visit: Payer: BC Managed Care – PPO | Admitting: Family Medicine

## 2022-08-12 ENCOUNTER — Other Ambulatory Visit: Payer: Self-pay | Admitting: Psychiatry

## 2022-08-15 NOTE — Telephone Encounter (Signed)
Last seen on 04/14/22 per note "-Prevention: Continue cyproheptadine 4 mg QHS " Follow up scheduled on 04/13/23 Last filled on 05/16/22 #90 tablets (90 day supply)

## 2022-10-04 ENCOUNTER — Encounter: Payer: Self-pay | Admitting: Psychiatry

## 2022-10-04 ENCOUNTER — Telehealth: Payer: Self-pay

## 2022-10-04 ENCOUNTER — Ambulatory Visit: Payer: BC Managed Care – PPO | Admitting: Psychiatry

## 2022-10-04 VITALS — BP 121/80 | HR 68 | Ht 66.0 in | Wt 176.8 lb

## 2022-10-04 DIAGNOSIS — G43719 Chronic migraine without aura, intractable, without status migrainosus: Secondary | ICD-10-CM | POA: Diagnosis not present

## 2022-10-04 MED ORDER — RIZATRIPTAN BENZOATE 10 MG PO TABS: 10.0000 mg | ORAL_TABLET | ORAL | 6 refills | Status: DC | PRN

## 2022-10-04 MED ORDER — EMGALITY 120 MG/ML ~~LOC~~ SOAJ: 2.0000 | Freq: Once | SUBCUTANEOUS | 0 refills | Status: AC

## 2022-10-04 MED ORDER — EMGALITY 120 MG/ML ~~LOC~~ SOAJ: 1.0000 | SUBCUTANEOUS | 6 refills | Status: DC

## 2022-10-04 MED ORDER — EMGALITY 120 MG/ML ~~LOC~~ SOAJ: 2.0000 | SUBCUTANEOUS | 0 refills | Status: DC

## 2022-10-04 NOTE — Progress Notes (Signed)
   CC:  headaches  Follow-up Visit  Last visit: 04/14/22  Brief HPI: 37 year old female with a history of hypothyroidism who follows in clinic for daily right sided headaches which began in May 2022. MRI/MRA/MRV were unremarkable.   Interval History: Headaches began to return around 4 months ago. Seemed to worsen around the time she stopped sertraline. They are currently daily, starting with lower severity at the beginning of the day then building in severity as the day goes on. Headaches begin in the back of her head and radiate forward. They are associated with photophobia and nausea.  Migraine days per month: 30 Headache free days per month: 0  Current Headache Regimen: Preventative: cyproheptadine 4 mg QHS Abortive: Tylenol   Prior Therapies                                  Prevention:                             Indomethacin 75 mg TID Topamax - side effects Zoloft 100 mg daily - side effects Cyproheptadine 4 mg QHS   Rescue: Excedrin Fioricet Tylenol  Physical Exam:   Vital Signs: BP 121/80 (BP Location: Right Arm, Patient Position: Sitting, Cuff Size: Normal)   Pulse 68   Ht 5\' 6"  (1.676 m)   Wt 176 lb 12.8 oz (80.2 kg)   BMI 28.54 kg/m  GENERAL:  well appearing, in no acute distress, alert  SKIN:  Color, texture, turgor normal. No rashes or lesions HEAD:  Normocephalic/atraumatic. RESP: normal respiratory effort MSK:  No gross joint deformities.   NEUROLOGICAL: Mental Status: Alert, oriented to person, place and time, Follows commands, and Speech fluent and appropriate. Cranial Nerves: PERRL, face symmetric, no dysarthria, hearing grossly intact Motor: moves all extremities equally Gait: normal-based.  IMPRESSION: 37 year old female with a history of hypothyroidism who presents for follow up of migraines. Her headaches have returned in the past 4 months and are now daily. She could not tolerate Topamax or antidepressants. Will start Emgality for migraine  prevention and start Maxalt for rescue.  PLAN: -Prevention: Start Emgality 120 mg monthly. Continue Cyproheptadine 4 mg at bedtime for now, consider weaning if headaches stabilize on Emgality -Rescue: Start Maxalt 10 mg PRN   Follow-up: 3 months  I spent a total of 40 minutes on the date of the service. Headache education was done. Discussed treatment options including preventive and acute medications. Discussed medication overuse headache and to limit use of acute treatments to no more than 2 days/week or 10 days/month. Discussed medication side effects, adverse reactions and drug interactions. Written educational materials and patient instructions outlining all of the above were given.  Ocie Doyne, MD 10/04/22 2:21 PM

## 2022-10-04 NOTE — Telephone Encounter (Signed)
PA submitted via CMM Key: [id number] Awaiting clinical questions

## 2022-10-05 ENCOUNTER — Ambulatory Visit: Payer: BC Managed Care – PPO | Admitting: Psychiatry

## 2022-10-05 MED ORDER — AJOVY 225 MG/1.5ML ~~LOC~~ SOAJ: 225.0000 mg | SUBCUTANEOUS | 6 refills | Status: DC

## 2022-10-05 NOTE — Telephone Encounter (Signed)
Contacted [employer], spoke to [employer]. I was on the phone for over a hr to initiate PA.  I was informed Emgality is not a part of patient benefits, authorization is not allowed." I did ask if they had preferred medication pt needs to try and fail. She stated it would be up to [place] to decide. Is there something else you would like to try?

## 2022-10-05 NOTE — Addendum Note (Signed)
Addended by: Ocie Doyne on: 10/05/2022 02:51 PM   Modules accepted: Orders

## 2022-10-05 NOTE — Telephone Encounter (Signed)
I guess we'll see if we can get any of the other monthly injectables approved since they won't tell us what they'll cover. I sent in an rx for Ajovy

## 2022-10-05 NOTE — Telephone Encounter (Signed)
Message from Plan Your PA Request has been closed. The receiver is not the PA processor for the drug requested. For further inquiries please contact the number on the back of the member's prescription card. (Message [phone]). ePA. MDO electronically initiated PA for Emgality. No PA drug in CAS. Renette Butters, Technician 10/04/2022 04:24 PM.  Will contact patients insurance to initiate PA over phone

## 2022-10-06 ENCOUNTER — Telehealth: Payer: Self-pay

## 2022-10-06 ENCOUNTER — Other Ambulatory Visit (HOSPITAL_COMMUNITY): Payer: Self-pay

## 2022-10-06 NOTE — Telephone Encounter (Signed)
PA submitted via CMM Key: [id number]. Awaiting clinical questions

## 2022-10-06 NOTE — Telephone Encounter (Signed)
Will submit PA for Ajovy

## 2022-10-10 NOTE — Telephone Encounter (Signed)
Will contact pt insurance again

## 2022-10-10 NOTE — Telephone Encounter (Signed)
Can you contact pt insurance to submit PA over the phone or if you can do it on CMM, great! I attempted to submit it and received the same message below for Ajovy and Emgality.  (FYI if you call insurance, automated system will not recognize [place] because it is [place] of [place] and will not let you bypass. I had to use member option and have someone transfer me to provider services)

## 2022-10-11 ENCOUNTER — Other Ambulatory Visit: Payer: Self-pay | Admitting: Psychiatry

## 2022-10-11 ENCOUNTER — Other Ambulatory Visit (HOSPITAL_COMMUNITY): Payer: Self-pay

## 2022-10-11 MED ORDER — METHYLPREDNISOLONE 4 MG PO TBPK: ORAL_TABLET | ORAL | 0 refills | Status: AC

## 2022-10-11 NOTE — Telephone Encounter (Addendum)
New card has been uploaded

## 2022-10-11 NOTE — Telephone Encounter (Signed)
Can we have PT upload insurance card-I do not see one scanned into the chart since 02/2021 and I believe the PT insurance has since changed. Thanks!

## 2022-10-12 ENCOUNTER — Telehealth: Payer: Self-pay

## 2022-10-12 ENCOUNTER — Other Ambulatory Visit (HOSPITAL_COMMUNITY): Payer: Self-pay

## 2022-10-12 NOTE — Telephone Encounter (Signed)
PA request has been Submitted. New Encounter created for follow up. For additional info see Pharmacy Prior Auth telephone encounter from 10/12/2022.

## 2022-10-12 NOTE — Telephone Encounter (Signed)
Pharmacy Patient Advocate Encounter   Received notification from Physician's Office that prior authorization for Ajovy 225MG /1.5ML AutoInjector is required/requested.   Insurance verification completed.   The patient is insured through  [employer]  .   Per test claim: PA required; PA submitted to [employer] via Prompt PA Key/confirmation #/EOC [id number] Status is pending    I called [employer] and was told to call [employer]-called PT and they could not even find the member via ID. Call Reference [id number].  I called [employer] [place] back again and was told to call [employer]-I called [employer] and they said they do not do the PA for this PT- Call Reference number: [id number]  I called [employer] back yet again and was told to submit via PromptPA. I tried to submit-had issue with identifying the PT - Call Reference [phone]  I called [employer] and they were able to find the PT insurance number-I was then able to submit PA on PromptPA.  I spent a total of 63 minutes with making calls to the insurance plans.

## 2022-10-13 ENCOUNTER — Other Ambulatory Visit (HOSPITAL_COMMUNITY): Payer: Self-pay

## 2022-10-13 NOTE — Telephone Encounter (Signed)
Sent mychart message

## 2022-10-13 NOTE — Telephone Encounter (Signed)
Pharmacy Patient Advocate Encounter  Received notification from [employer] that Prior Authorization for AJOVY (fremanezumab-vfrm) injection 225MG /1.5ML auto-injectors has been APPROVED from 10-12-2022 to 01-12-2023. Ran test claim, Copay is $24.98 for 1.99mL per 28 days. This test claim was processed through [facility]- copay amounts may vary at other pharmacies due to pharmacy/plan contracts, or as the patient moves through the different stages of their insurance plan.   PA #/Case ID/Reference #: [id number]

## 2022-10-17 NOTE — Telephone Encounter (Signed)
I called pharmacy to verify approval. Ajovy claim did go through. Pt is aware

## 2022-11-03 ENCOUNTER — Ambulatory Visit: Payer: BC Managed Care – PPO | Admitting: Psychiatry

## 2022-12-19 ENCOUNTER — Other Ambulatory Visit (HOSPITAL_COMMUNITY): Payer: Self-pay

## 2022-12-28 ENCOUNTER — Encounter: Payer: Self-pay | Admitting: Adult Health

## 2022-12-28 ENCOUNTER — Ambulatory Visit: Payer: BC Managed Care – PPO | Admitting: Adult Health

## 2022-12-28 VITALS — BP 117/82 | HR 80 | Ht 66.0 in | Wt 174.6 lb

## 2022-12-28 DIAGNOSIS — G43719 Chronic migraine without aura, intractable, without status migrainosus: Secondary | ICD-10-CM | POA: Diagnosis not present

## 2022-12-28 DIAGNOSIS — M5481 Occipital neuralgia: Secondary | ICD-10-CM

## 2022-12-28 MED ORDER — GABAPENTIN 100 MG PO CAPS: 100.0000 mg | ORAL_CAPSULE | Freq: Every day | ORAL | 10 refills | Status: DC

## 2022-12-28 NOTE — Patient Instructions (Addendum)
Your Plan:  Stop Ajovy Start Gabapentin 100 mg at bedtime  Ok to wean off cyproheptadine- 1/2 tablet for a week then stop Avoid taking OTC medications on a daily basis as it can cause rebound headaches.    Thank you for coming to see [place] at [facility]. I hope we have been able to provide you high quality care today.  You may receive a patient satisfaction survey over the next few weeks. We would appreciate your feedback and comments so that we may continue to improve ourselves and the health of our patients.

## 2022-12-28 NOTE — Progress Notes (Signed)
PATIENT: Erika Davidson DOB: 1985-12-02  REASON FOR VISIT: follow up HISTORY FROM: patient PRIMARY NEUROLOGIST: Dr. Delena Bali  Chief Complaint  Patient presents with   Follow-up    Rm 18, alone.  Migraines. Increased since May.  Daily headache for 2 months.  Level 9.  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      HISTORY OF PRESENT ILLNESS: Today 12/28/22  Erika Davidson is a 37 y.o. female who has been followed in this office for migraine headaches. Returns today for follow-up.  She states Ajovy has not worked for her.  She has not gotten any relief in her migraine frequency or severity.  She states that her headaches always start in the right occipital region and travels up the back of the head into the right frontal region to her right eye.  She states that it feels as if she would have a droop on the right side but there is facial symmetry present.  She reports photophobia and phonophobia.  Denies nausea or vomiting.  She states that if she presses on the right occipital region it does cause a burning sensation.  Otherwise it feels as if someone has their fist in the back of her head.  She states that she typically wakes up and it is a 3 out of 10 on the pain scale but by the end of the day it is an 8 or 9.  She states about a week ago she started taking naproxen and using a heating pad and that resolved her headache for about a week however the last 2 nights she has not used this and the headache came back.  Initially she was on Cyprohedptadine and this worked well but it no longer works for her.  She also reports that if she pops her neck she will also get the same severe symptoms in the right occipital region that radiates to the front of the head.  She does state in the past she has been told that she has some abnormality in the cervical spine? she returns today for an evaluation.    HISTORY 37 year old female with a history of hypothyroidism who follows in clinic for daily right  sided headaches which began in May 2022. MRI/MRA/MRV were unremarkable.    Interval History: Headaches began to return around 4 months ago. Seemed to worsen around the time she stopped sertraline. They are currently daily, starting with lower severity at the beginning of the day then building in severity as the day goes on. Headaches begin in the back of her head and radiate forward. They are associated with photophobia and nausea.   Migraine days per month: 30 Headache free days per month: 0   Current Headache Regimen: Preventative: cyproheptadine 4 mg QHS Abortive: Tylenol     Prior Therapies                                  Prevention:                             Indomethacin 75 mg TID Topamax - side effects Zoloft 100 mg daily - side effects Cyproheptadine 4 mg QHS   Rescue: Excedrin Fioricet Tylenol  REVIEW OF SYSTEMS: Out of a complete 14 system review of symptoms, the patient complains only of the following symptoms, and all other reviewed systems are negative.  ALLERGIES: Allergies  Allergen Reactions   Wheat Diarrhea and Nausea And Vomiting    HOME MEDICATIONS: Outpatient Medications Prior to Visit  Medication Sig Dispense Refill   cyproheptadine (PERIACTIN) 4 MG tablet TAKE 1 TABLET BY MOUTH EVERYDAY AT BEDTIME 90 tablet 2   Fremanezumab-vfrm (AJOVY) 225 MG/1.5ML SOAJ Inject 225 mg into the skin every 30 (thirty) days. 1.68 mL 6   levothyroxine (SYNTHROID) 50 MCG tablet Take 50 mcg by mouth daily before breakfast.     naproxen (NAPROSYN) 500 MG tablet Take 500 mg by mouth 2 (two) times daily with a meal.     methylPREDNISolone (MEDROL DOSEPAK) 4 MG TBPK tablet Take as directed by packaging (Patient not taking: Reported on 12/28/2022) 1 each 0   rizatriptan (MAXALT) 10 MG tablet Take 1 tablet (10 mg total) by mouth as needed for migraine. May repeat in 2 hours if needed (Patient not taking: Reported on 12/28/2022) 10 tablet 6   levothyroxine (SYNTHROID) 25 MCG  tablet Take 50 mcg by mouth daily before breakfast.     No facility-administered medications prior to visit.    PAST MEDICAL HISTORY: Past Medical History:  Diagnosis Date   Celiac disease    Headache     PAST SURGICAL HISTORY: Past Surgical History:  Procedure Laterality Date   NO PAST SURGERIES      FAMILY HISTORY: Family History  Problem Relation Age of Onset   ADD / ADHD Neg Hx     SOCIAL HISTORY: Social History   Socioeconomic History   Marital status: Married    Spouse name: Treesa Grate   Number of children: Not on file   Years of education: Not on file   Highest education level: Not on file  Occupational History   Not on file  Tobacco Use   Smoking status: Former    Types: E-cigarettes   Smokeless tobacco: Not on file  Vaping Use   Vaping status: Former  Substance and Sexual Activity   Alcohol use: Not Currently   Drug use: Never   Sexual activity: Yes    Birth control/protection: None  Other Topics Concern   Not on file  Social History Narrative   Not on file   Social Determinants of Health   Financial Resource Strain: Low Risk  (04/08/2022)   Received from [facility], [facility]   Overall Financial Resource Strain (CARDIA)    Difficulty of Paying Living Expenses: Not hard at all  Food Insecurity: No Food Insecurity (04/08/2022)   Received from [facility], [facility]   Hunger Vital Sign    Worried About Running Out of Food in the Last Year: Never true    Ran Out of Food in the Last Year: Never true  Transportation Needs: No Transportation Needs (04/08/2022)   Received from [facility], [facility]   PRAPARE - Transportation    Lack of Transportation (Medical): No    Lack of Transportation (Non-Medical): No  Physical Activity: Unknown (04/08/2022)   Received from [facility], [facility]   Exercise Vital Sign    Days of Exercise per Week: 0 days    Minutes of Exercise per Session: Not on file  Stress: No Stress Concern Present  (04/08/2022)   Received from [facility] - Occupational Stress Questionnaire    Feeling of Stress : Only a little  Social Connections: Moderately Integrated (04/08/2022)   Received from [facility], [facility]   Social Network    How would  you rate your social network (family, work, friends)?: Adequate participation with social networks  Intimate Partner Violence: Not At Risk (04/08/2022)   Received from [facility], [facility]   HITS    Over the last 12 months how often did your partner physically hurt you?: 1    Over the last 12 months how often did your partner insult you or talk down to you?: 1    Over the last 12 months how often did your partner threaten you with physical harm?: 1    Over the last 12 months how often did your partner scream or curse at you?: 1      PHYSICAL EXAM  Vitals:   12/28/22 1330  BP: 117/82  Pulse: 80  Weight: 174 lb 9.6 oz (79.2 kg)  Height: 5\' 6"  (1.676 m)   Body mass index is 28.18 kg/m.  Generalized: Well developed, in no acute distress   Neurological examination  Mentation: Alert oriented to time, place, history taking. Follows all commands speech and language fluent Cranial nerve II-XII: Pupils were equal round reactive to light. Extraocular movements were full, visual field were full on confrontational test. Facial sensation and strength were normal. Uvula tongue midline. Head turning and shoulder shrug  were normal and symmetric. Motor: The motor testing reveals 5 over 5 strength of all 4 extremities. Good symmetric motor tone is noted throughout.  Sensory: Sensory testing is intact to soft touch on all 4 extremities. No evidence of extinction is noted.  Coordination: Cerebellar testing reveals good finger-nose-finger and heel-to-shin bilaterally.  Gait and station: Gait is normal.  Reflexes: Deep tendon reflexes are symmetric and normal bilaterally.   DIAGNOSTIC DATA  (LABS, IMAGING, TESTING) - I reviewed patient records, labs, notes, testing and imaging myself where available.  Lab Results  Component Value Date   WBC 14.7 (H) 10/26/2021   HGB 10.6 (L) 10/26/2021   HCT 31.6 (L) 10/26/2021   MCV 93.2 10/26/2021   PLT 200 10/26/2021      Component Value Date/Time   NA 135 10/25/2021 0132   K 4.1 10/25/2021 0132   CL 105 10/25/2021 0132   CO2 19 (L) 10/25/2021 0132   GLUCOSE 96 10/25/2021 0132   BUN 6 10/25/2021 0132   CREATININE 0.68 10/25/2021 0132   CALCIUM 10.0 10/25/2021 0132   PROT 6.9 10/25/2021 0132   ALBUMIN 3.1 (L) 10/25/2021 0132   AST 30 10/25/2021 0132   ALT 21 10/25/2021 0132   ALKPHOS 209 (H) 10/25/2021 0132   BILITOT 0.4 10/25/2021 0132   GFRNONAA >60 10/25/2021 0132       ASSESSMENT AND PLAN 37 y.o. year old female  has a past medical history of Celiac disease and Headache. here with:  1.  Migraine headaches 2.  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  -Stop Ajovy as she has not found that beneficial -Will wean off Cyprohedptadine-half a tablet for 1 week then discontinue -Start gabapentin 100 mg at bedtime advised that we can increase the dose if its not beneficial -If she continues to have discomfort may consider MRI of the cervical spine -Follow-up in 3 months through virtual visit     Butch Penny, MSN, NP-C 12/28/2022, 1:59 PM [facility] [address], [address] [place], [place] [address] [phone]

## 2023-01-04 ENCOUNTER — Encounter: Payer: Self-pay | Admitting: Adult Health

## 2023-01-12 MED ORDER — GABAPENTIN 100 MG PO CAPS: 100.0000 mg | ORAL_CAPSULE | Freq: Two times a day (BID) | ORAL | 11 refills | Status: DC

## 2023-01-12 NOTE — Addendum Note (Signed)
Addended by: Enedina Finner on: 01/12/2023 04:45 PM   Modules accepted: Orders

## 2023-03-24 ENCOUNTER — Encounter: Payer: Self-pay | Admitting: Adult Health

## 2023-03-27 ENCOUNTER — Telehealth: Payer: BC Managed Care – PPO | Admitting: Adult Health

## 2023-03-27 DIAGNOSIS — M542 Cervicalgia: Secondary | ICD-10-CM | POA: Diagnosis not present

## 2023-03-27 DIAGNOSIS — G43909 Migraine, unspecified, not intractable, without status migrainosus: Secondary | ICD-10-CM

## 2023-03-27 NOTE — Progress Notes (Signed)
PATIENT: Erika Davidson DOB: [birth year]  REASON FOR VISIT: follow up HISTORY FROM: patient  Virtual Visit via Video Note  I connected with Erika Davidson on 03/27/23 at 11:30 AM EST by a video enabled telemedicine application located remotely at [facility] and verified that I am speaking with the correct person using two identifiers who was located at their own home.   I discussed the limitations of evaluation and management by telemedicine and the availability of in person appointments. The patient expressed understanding and agreed to proceed.   PATIENT: Erika Davidson DOB: 05-01-1985  REASON FOR VISIT: follow up HISTORY FROM: patient  HISTORY OF PRESENT ILLNESS: Today 03/27/23:  Erika Davidson is a 38 y.o. female with a history of migraine headaches. Returns today for follow-up.  At the last visit her headaches seem to be starting in the neck and will travel up the back of her head into the occipital region.  She states that initially gabapentin was beneficial.  She states that she went back to yoga and it triggered a migraine the next day.  She states eventually that her neck pain improved with gabapentin again.  When she felt like she was stable she tried to return to yoga but again the next day she woke up with neck pain radiating to the occipital region.  This has been ongoing for the last 3 weeks.  She states that a heating pad helps but as soon as she takes the heat off the discomfort returns.  She denies any numbness or tingling down the arms.  No weakness in the upper extremities.  She has tried taking over-the-counter medications with no relief.  She does feel that gabapentin has been beneficial.  However she has noticed that when she takes the bedtime dose it has been affecting her sleep.  States that she has been waking up in the middle of the night unable to go back to sleep.  She states that the lack of sleep has also been affecting her anxiety.  Although  she states that she is not sure if her increase in anxiety is due to lack of sleep or potentially side effect of gabapentin.  She has never had imaging of the neck.  She returns today for an evaluation.     12/28/22   Erika Davidson is a 38 y.o. female who has been followed in this office for migraine headaches. Returns today for follow-up.  She states Ajovy has not worked for her.  She has not gotten any relief in her migraine frequency or severity.  She states that her headaches always start in the right occipital region and travels up the back of the head into the right frontal region to her right eye.  She states that it feels as if she would have a droop on the right side but there is facial symmetry present.  She reports photophobia and phonophobia.  Denies nausea or vomiting.  She states that if she presses on the right occipital region it does cause a burning sensation.  Otherwise it feels as if someone has their fist in the back of her head.  She states that she typically wakes up and it is a 3 out of 10 on the pain scale but by the end of the day it is an 8 or 9.  She states about a week ago she started taking naproxen and using a heating pad and that resolved her headache for about a week however the last 2  nights she has not used this and the headache came back.  Initially she was on Cyprohedptadine and this worked well but it no longer works for her.  She also reports that if she pops her neck she will also get the same severe symptoms in the right occipital region that radiates to the front of the head.  She does state in the past she has been told that she has some abnormality in the cervical spine? she returns today for an evaluation.       HISTORY 38 year old female with a history of hypothyroidism who follows in clinic for daily right sided headaches which began in May 2022. MRI/MRA/MRV were unremarkable.    Interval History: Headaches began to return around 4 months ago. Seemed to  worsen around the time she stopped sertraline. They are currently daily, starting with lower severity at the beginning of the day then building in severity as the day goes on. Headaches begin in the back of her head and radiate forward. They are associated with photophobia and nausea.   Migraine days per month: 30 Headache free days per month: 0   Current Headache Regimen: Preventative: cyproheptadine 4 mg QHS Abortive: Tylenol     Prior Therapies                                  Prevention:                             Indomethacin 75 mg TID Topamax - side effects Zoloft 100 mg daily - side effects Cyproheptadine 4 mg QHS   Rescue: Excedrin Fioricet Tylenol  REVIEW OF SYSTEMS: Out of a complete 14 system review of symptoms, the patient complains only of the following symptoms, and all other reviewed systems are negative.  ALLERGIES: Allergies  Allergen Reactions   Wheat Diarrhea and Nausea And Vomiting    HOME MEDICATIONS: Outpatient Medications Prior to Visit  Medication Sig Dispense Refill   cyproheptadine (PERIACTIN) 4 MG tablet TAKE 1 TABLET BY MOUTH EVERYDAY AT BEDTIME 90 tablet 2   Fremanezumab-vfrm (AJOVY) 225 MG/1.5ML SOAJ Inject 225 mg into the skin every 30 (thirty) days. 1.68 mL 6   gabapentin (NEURONTIN) 100 MG capsule Take 1 capsule (100 mg total) by mouth 2 (two) times daily. 60 capsule 11   levothyroxine (SYNTHROID) 50 MCG tablet Take 50 mcg by mouth daily before breakfast.     methylPREDNISolone (MEDROL DOSEPAK) 4 MG TBPK tablet Take as directed by packaging (Patient not taking: Reported on 12/28/2022) 1 each 0   naproxen (NAPROSYN) 500 MG tablet Take 500 mg by mouth 2 (two) times daily with a meal.     rizatriptan (MAXALT) 10 MG tablet Take 1 tablet (10 mg total) by mouth as needed for migraine. May repeat in 2 hours if needed (Patient not taking: Reported on 12/28/2022) 10 tablet 6   No facility-administered medications prior to visit.    PAST MEDICAL  HISTORY: Past Medical History:  Diagnosis Date   Celiac disease    Headache     PAST SURGICAL HISTORY: Past Surgical History:  Procedure Laterality Date   NO PAST SURGERIES      FAMILY HISTORY: Family History  Problem Relation Age of Onset   ADD / ADHD Neg Hx     SOCIAL HISTORY: Social History   Socioeconomic History   Marital status: Married  Spouse name: Melea Prezioso   Number of children: Not on file   Years of education: Not on file   Highest education level: Not on file  Occupational History   Not on file  Tobacco Use   Smoking status: Former    Types: E-cigarettes   Smokeless tobacco: Not on file  Vaping Use   Vaping status: Former  Substance and Sexual Activity   Alcohol use: Not Currently   Drug use: Never   Sexual activity: Yes    Birth control/protection: None  Other Topics Concern   Not on file  Social History Narrative   Not on file   Social Drivers of Health   Financial Resource Strain: Low Risk  (04/08/2022)   Received from [facility], [facility]   Overall Financial Resource Strain (CARDIA)    Difficulty of Paying Living Expenses: Not hard at all  Food Insecurity: No Food Insecurity (04/08/2022)   Received from [facility], [facility]   Hunger Vital Sign    Worried About Running Out of Food in the Last Year: Never true    Ran Out of Food in the Last Year: Never true  Transportation Needs: No Transportation Needs (04/08/2022)   Received from [facility], [facility]   PRAPARE - Transportation    Lack of Transportation (Medical): No    Lack of Transportation (Non-Medical): No  Physical Activity: Unknown (04/08/2022)   Received from [facility], [facility]   Exercise Vital Sign    Days of Exercise per Week: 0 days    Minutes of Exercise per Session: Not on file  Stress: No Stress Concern Present (04/08/2022)   Received from [facility], [facility]ational Health - Occupational Stress  Questionnaire    Feeling of Stress : Only a little  Social Connections: Moderately Integrated (04/08/2022)   Received from [facility], [facility]   Social Network    How would you rate your social network (family, work, friends)?: Adequate participation with social networks  Intimate Partner Violence: Not At Risk (04/08/2022)   Received from [facility], [facility]   HITS    Over the last 12 months how often did your partner physically hurt you?: Never    Over the last 12 months how often did your partner insult you or talk down to you?: Never    Over the last 12 months how often did your partner threaten you with physical harm?: Never    Over the last 12 months how often did your partner scream or curse at you?: Never      PHYSICAL EXAM Generalized: Well developed, in no acute distress   Neurological examination  Mentation: Alert oriented to time, place, history taking. Follows all commands speech and language fluent Cranial nerve II-XII:Extraocular movements were full. Facial symmetry noted.   DIAGNOSTIC DATA (LABS, IMAGING, TESTING) - I reviewed patient records, labs, notes, testing and imaging myself where available.  Lab Results  Component Value Date   WBC 14.7 (H) 10/26/2021   HGB 10.6 (L) 10/26/2021   HCT 31.6 (L) 10/26/2021   MCV 93.2 10/26/2021   PLT 200 10/26/2021      Component Value Date/Time   NA 135 10/25/2021 0132   K 4.1 10/25/2021 0132   CL 105 10/25/2021 0132   CO2 19 (L) 10/25/2021 0132   GLUCOSE 96 10/25/2021 0132   BUN 6 10/25/2021 0132   CREATININE 0.68 10/25/2021 0132   CALCIUM 10.0 10/25/2021 0132   PROT  6.9 10/25/2021 0132   ALBUMIN 3.1 (L) 10/25/2021 0132   AST 30 10/25/2021 0132   ALT 21 10/25/2021 0132   ALKPHOS 209 (H) 10/25/2021 0132   BILITOT 0.4 10/25/2021 0132   GFRNONAA >60 10/25/2021 0132      ASSESSMENT AND PLAN 38 y.o. year old female  has a past medical history of Celiac disease and Headache. here with:  1.   Migraine headaches 2.  Neck pain  -We had a discussion regarding her medication.  For now we will adjust the times that she takes gabapentin.  She will take 100 mg in the a.m. and second dose will be at 3 PM.  Hopefully she will be able to tolerate this better. -Did advise if she does not receive any improvement in side effects or neck pain we can consider switching her to nortriptyline or tizanidine. -MRI of the cervical spine with and without contrast ordered.  Patient continues to have ongoing neck pain specifically with neck stretches.  Will rule out any structural changes that could be contributing to her discomfort. -Follow-up in 6 months or sooner if needed  Patient was previously seen by Dr. Delena Bali.  She is no longer in our office.  She will be reassigned to Dr. Marjory Lies as her primary neurologist.  She will continue to follow with me for now.  I will consult with Dr. Marjory Lies when needed.    Butch Penny, MSN, NP-C 03/27/2023, 11:17 AM [facility] [address], [address] [place], [place] [address] [phone]

## 2023-03-27 NOTE — Patient Instructions (Addendum)
Your Plan:  Change gabapentin to at breakfast and second dose at 3PM If this doesn't help with your sleep or anxiety is still an issue then we can consider starting nortriptyline or tizandine  MRI of the cervical spine If your symptoms worsen or you develop new symptoms please let us know.   Thank you for coming to see [place] at [facility]. I hope we have been able to provide you high quality care today.  You may receive a patient satisfaction survey over the next few weeks. We would appreciate your feedback and comments so that we may continue to improve ourselves and the health of our patients.

## 2023-03-28 ENCOUNTER — Ambulatory Visit: Payer: BC Managed Care – PPO

## 2023-03-28 DIAGNOSIS — M542 Cervicalgia: Secondary | ICD-10-CM | POA: Diagnosis not present

## 2023-03-28 MED ORDER — GADOBENATE DIMEGLUMINE 529 MG/ML IV SOLN
15.0000 mL | Freq: Once | INTRAVENOUS | Status: AC | PRN
  Administered 2023-03-28: 15 mL via INTRAVENOUS

## 2023-04-03 ENCOUNTER — Other Ambulatory Visit: Payer: Self-pay | Admitting: Anesthesiology

## 2023-04-03 NOTE — Telephone Encounter (Signed)
Called pt and informed of normal MRI results. Pt stated that she continues with headaches and neck pain. States that even after adjusting the time she takes the Gabapentin still not helping with her discomfort and pain. States she would like a change in medication as discussed with Aundra Millet on her last visit. Advised pt that I would be sending this information to [facility] and once she reviews it we will be getting back to her with her recommendations.

## 2023-04-03 NOTE — Telephone Encounter (Signed)
-----   Message from [facility] sent at 04/03/2023  8:59 AM EST ----- Please let patient know that MRI was unremarkable.  Most likely her discomfort is muscular.  Please inquire if the gabapentin has been helpful

## 2023-04-03 NOTE — Telephone Encounter (Signed)
We can try tizanidine 2 mg at bedtime. Please review SE with her.

## 2023-04-04 MED ORDER — TIZANIDINE HCL 2 MG PO TABS: 2.0000 mg | ORAL_TABLET | Freq: Every day | ORAL | 5 refills | Status: DC

## 2023-04-04 NOTE — Telephone Encounter (Signed)
 I called pt back.  I relayed that [place], NP did change her medication to tizanindine 2mg  po at bedtime.  I relayed :  Common tizanidine  side effects may include: drowsiness, dizziness, weakness; feeling nervous; and there are others:  Pt stated she does read the SE from pharmacy.  She asked about taking the gabapentin .  I told her that she can take 1 daily for 3 days then stop.   She had questions about the scan, (she spoke to other professional, and questioning the minimal disc bulging , if this could be causing her occipital pain, (she had tried numerous therapies, (PT, chiropracter, heat, etc) nothing has really helped.  The gabapentin  helped initially back in October, then in December due to time change of taking gabapentin  things seemed to come back. Turning she has radiating of pain occiptal, R facial numbness.  She said that laying on her back relieved from level 15, to level 8-9.  She wanted input, a call or mychart.  I would relay message.

## 2023-04-13 ENCOUNTER — Ambulatory Visit: Payer: BC Managed Care – PPO | Admitting: Psychiatry

## 2023-04-14 ENCOUNTER — Ambulatory Visit: Payer: BC Managed Care – PPO | Admitting: Adult Health

## 2023-04-16 ENCOUNTER — Other Ambulatory Visit: Payer: Self-pay

## 2023-04-16 ENCOUNTER — Emergency Department (HOSPITAL_COMMUNITY)
Admission: EM | Admit: 2023-04-16 | Discharge: 2023-04-17 | Disposition: A | Discharge status: Home / Self Care | Payer: BC Managed Care – PPO | Attending: Emergency Medicine | Admitting: Emergency Medicine

## 2023-04-16 ENCOUNTER — Encounter (HOSPITAL_COMMUNITY): Payer: Self-pay | Admitting: Emergency Medicine

## 2023-04-16 ENCOUNTER — Emergency Department (HOSPITAL_COMMUNITY): Payer: BC Managed Care – PPO

## 2023-04-16 DIAGNOSIS — D72829 Elevated white blood cell count, unspecified: Secondary | ICD-10-CM | POA: Insufficient documentation

## 2023-04-16 DIAGNOSIS — R112 Nausea with vomiting, unspecified: Secondary | ICD-10-CM | POA: Insufficient documentation

## 2023-04-16 DIAGNOSIS — R42 Dizziness and giddiness: Secondary | ICD-10-CM | POA: Diagnosis present

## 2023-04-16 DIAGNOSIS — Z5321 Procedure and treatment not carried out due to patient leaving prior to being seen by health care provider: Secondary | ICD-10-CM | POA: Diagnosis not present

## 2023-04-16 LAB — COMPREHENSIVE METABOLIC PANEL
ALT: 38 U/L (ref 0–44)
AST: 27 U/L (ref 15–41)
Albumin: 4.4 g/dL (ref 3.5–5.0)
Alkaline Phosphatase: 72 U/L (ref 38–126)
Anion gap: 11 (ref 5–15)
BUN: 12 mg/dL (ref 6–20)
CO2: 21 mmol/L — ABNORMAL LOW (ref 22–32)
Calcium: 9.5 mg/dL (ref 8.9–10.3)
Chloride: 105 mmol/L (ref 98–111)
Creatinine, Ser: 0.6 mg/dL (ref 0.44–1.00)
GFR, Estimated: >60 mL/min (ref >60)
Glucose, Bld: 111 mg/dL — ABNORMAL HIGH (ref 70–99)
Potassium: 4.2 mmol/L (ref 3.5–5.1)
Sodium: 137 mmol/L (ref 135–145)
Total Bilirubin: 0.6 mg/dL (ref 0.0–1.2)
Total Protein: 7.9 g/dL (ref 6.5–8.1)

## 2023-04-16 LAB — CBC
HCT: 43.9 % (ref 36.0–46.0)
Hemoglobin: 14.7 g/dL (ref 12.0–15.0)
MCH: 31.7 pg (ref 26.0–34.0)
MCHC: 33.5 g/dL (ref 30.0–36.0)
MCV: 94.6 fL (ref 80.0–100.0)
Platelets: 294 10*3/uL (ref 150–400)
RBC: 4.64 MIL/uL (ref 3.87–5.11)
RDW: 12 % (ref 11.5–15.5)
WBC: 11.3 10*3/uL — ABNORMAL HIGH (ref 4.0–10.5)
nRBC: 0 % (ref 0.0–0.2)

## 2023-04-16 LAB — LIPASE, BLOOD: Lipase: 29 U/L (ref 11–51)

## 2023-04-16 MED ORDER — SODIUM CHLORIDE 0.9 % IV BOLUS
500.0000 mL | Freq: Once | INTRAVENOUS | Status: AC
  Administered 2023-04-16: 500 mL via INTRAVENOUS

## 2023-04-16 MED ORDER — IOHEXOL 350 MG/ML SOLN
75.0000 mL | Freq: Once | INTRAVENOUS | Status: AC | PRN
  Administered 2023-04-16: 75 mL via INTRAVENOUS

## 2023-04-16 MED ORDER — MECLIZINE HCL 12.5 MG PO TABS
25.0000 mg | ORAL_TABLET | Freq: Once | ORAL | Status: AC
  Administered 2023-04-16: 25 mg via ORAL
  Filled 2023-04-16: qty 2

## 2023-04-16 MED ORDER — MECLIZINE HCL 12.5 MG PO TABS: 12.5000 mg | ORAL_TABLET | Freq: Three times a day (TID) | ORAL | 0 refills | Status: AC | PRN

## 2023-04-16 MED ORDER — PROCHLORPERAZINE EDISYLATE 10 MG/2ML IJ SOLN: 10.0000 mg | Freq: Once | INTRAMUSCULAR | Status: DC

## 2023-04-16 MED ORDER — ONDANSETRON HCL 4 MG PO TABS: 4.0000 mg | ORAL_TABLET | Freq: Four times a day (QID) | ORAL | 0 refills | Status: AC

## 2023-04-16 NOTE — ED Triage Notes (Addendum)
 Pt bib EMS for c/o emesis since 9am. States she is unable to keep anything down. EMS reported that pt told them she had a THC gummy around 9pm last night. EMS administered 4mg  Zofran IV PTA. Pt drowsy in triage.

## 2023-04-16 NOTE — Discharge Instructions (Addendum)
 It was a pleasure taking part in your care.  As discussed, your CT scan of your head was unremarkable. You stated that you were still dizzy however your dizziness had decreased after meclizine.  We offered you a transfer to [facility] for MRI to fully rule out sinister causes of the dizziness but you are deferring at this time.  If you wake up in the morning and continue to have dizziness, please proceed to [facility] for MRI of your brain.  Please pick up meclizine sent to pharmacy on file.  Please take Zofran every 6 hours as needed for nausea and vomiting.

## 2023-04-16 NOTE — ED Provider Notes (Signed)
 [place] EMERGENCY DEPARTMENT AT [facility] Provider Note   CSN: [id number] Arrival date & time: 04/16/23  1911     History  Chief Complaint  Patient presents with   Emesis    Erika Davidson is a 38 y.o. female with history of celiac disease, headaches, vertigo.  Patient presents to ED for evaluation of dizziness, nausea and vomiting.  Patient reports that she woke up this morning around 5 AM to let her dog out when she experienced a sudden onset of dizziness described as the room spinning.  She reports a history of the same, has been diagnosed with vertigo in the past.  She reports that she attempted to take meclizine multiple times throughout the day but threw this up as a result.  She reports that last night reports went to bed she did consume a THC a gummy.  She reports that she purchased this from a local head shop, she is unsure of the dosage of this gummy.  Unsure of the name or brand of this gummy.  Her husband is here with her and reports that her dizziness got so severe that she asked him to call 911 who then came and transported her to the hospital.  Denies one-sided onus or numbness, chest pain, shortness of breath, headache or neck pain.  Denies blurred vision.  Denies abdominal pain, diarrhea, dysuria, fevers.   Emesis Associated symptoms: no headaches        Home Medications Prior to Admission medications   Medication Sig Start Date End Date Taking? Authorizing Provider  meclizine (ANTIVERT) 12.5 MG tablet Take 1 tablet (12.5 mg total) by mouth 3 (three) times daily as needed for dizziness. 04/16/23  Yes Al Decant, PA-C  ondansetron (ZOFRAN) 4 MG tablet Take 1 tablet (4 mg total) by mouth every 6 (six) hours. 04/16/23  Yes Al Decant, PA-C  cyproheptadine (PERIACTIN) 4 MG tablet TAKE 1 TABLET BY MOUTH EVERYDAY AT BEDTIME 08/15/22   Ocie Doyne, MD  Fremanezumab-vfrm (AJOVY) 225 MG/1.5ML SOAJ Inject 225 mg into the skin every 30 (thirty)  days. 10/05/22   Ocie Doyne, MD  gabapentin (NEURONTIN) 100 MG capsule Take 1 capsule (100 mg total) by mouth 2 (two) times daily. 01/12/23   Butch Penny, NP  levothyroxine (SYNTHROID) 50 MCG tablet Take 50 mcg by mouth daily before breakfast.    [provider]  methylPREDNISolone (MEDROL DOSEPAK) 4 MG TBPK tablet Take as directed by packaging Patient not taking: Reported on 12/28/2022 10/11/22   Ocie Doyne, MD  naproxen (NAPROSYN) 500 MG tablet Take 500 mg by mouth 2 (two) times daily with a meal.    [provider]  rizatriptan (MAXALT) 10 MG tablet Take 1 tablet (10 mg total) by mouth as needed for migraine. May repeat in 2 hours if needed Patient not taking: Reported on 12/28/2022 10/04/22   Ocie Doyne, MD  tiZANidine (ZANAFLEX) 2 MG tablet Take 1 tablet (2 mg total) by mouth at bedtime. 04/04/23   Butch Penny, NP      Allergies    Wheat    Review of Systems   Review of Systems  Gastrointestinal:  Positive for nausea and vomiting.  Musculoskeletal:  Negative for neck pain.  Neurological:  Positive for dizziness. Negative for facial asymmetry, speech difficulty, weakness, numbness and headaches.  All other systems reviewed and are negative.   Physical Exam Updated Vital Signs BP 109/78   Pulse 92   Temp 98.8 F (37.1 C) (Oral)  Resp 18   Ht 5\' 6"  (1.676 m)   Wt 71.7 kg   LMP 04/15/2023 (Exact Date)   SpO2 97%   BMI 25.50 kg/m  Physical Exam Vitals and nursing note reviewed.  Constitutional:      General: She is not in acute distress.    Appearance: Normal appearance. She is not ill-appearing, toxic-appearing or diaphoretic.  HENT:     Head: Normocephalic and atraumatic.     Nose: Nose normal.     Mouth/Throat:     Mouth: Mucous membranes are moist.     Pharynx: Oropharynx is clear.  Eyes:     Extraocular Movements: Extraocular movements intact.     Conjunctiva/sclera: Conjunctivae normal.     Pupils: Pupils are equal, round, and  reactive to light.  Cardiovascular:     Rate and Rhythm: Normal rate and regular rhythm.  Pulmonary:     Effort: Pulmonary effort is normal.     Breath sounds: Normal breath sounds. No wheezing.  Abdominal:     General: Abdomen is flat. Bowel sounds are normal.     Palpations: Abdomen is soft.     Tenderness: There is no abdominal tenderness.  Musculoskeletal:     Cervical back: Normal range of motion and neck supple. No tenderness.  Skin:    Capillary Refill: Capillary refill takes less than 2 seconds.  Neurological:     General: No focal deficit present.     Mental Status: She is alert and oriented to person, place, and time.     GCS: GCS eye subscore is 4. GCS verbal subscore is 5. GCS motor subscore is 6.     Cranial Nerves: Cranial nerves 2-12 are intact. No cranial nerve deficit.     Sensory: Sensation is intact. No sensory deficit.     Motor: Motor function is intact. No weakness.     Coordination: Coordination is intact. Heel to [facility] Test normal.     Comments: CN III through XII intact.  Intact finger-nose, heel-to-shin.  Equal grip strength upper extremity bilaterally.  5-5 strength bilateral lower extremities.  No pronator drift.  No slurred speech.     ED Results / Procedures / Treatments   Labs (all labs ordered are listed, but only abnormal results are displayed) Labs Reviewed  COMPREHENSIVE METABOLIC PANEL - Abnormal; Notable for the following components:      Result Value   CO2 21 (*)    Glucose, Bld 111 (*)    All other components within normal limits  CBC - Abnormal; Notable for the following components:   WBC 11.3 (*)    All other components within normal limits  LIPASE, BLOOD  URINALYSIS, ROUTINE W REFLEX MICROSCOPIC  POC URINE PREG, ED    EKG None  Radiology CT Angio Head W or Wo Contrast Result Date: 04/16/2023 CLINICAL DATA:  Initial evaluation for acute vertigo.  Emesis. EXAM: CT ANGIOGRAPHY HEAD TECHNIQUE: Multidetector CT imaging of the head  was performed using the standard protocol during bolus administration of intravenous contrast. Multiplanar CT image reconstructions and MIPs were obtained to evaluate the vascular anatomy. RADIATION DOSE REDUCTION: This exam was performed according to the departmental dose-optimization program which includes automated exposure control, adjustment of the mA and/or kV according to patient size and/or use of iterative reconstruction technique. CONTRAST:  75mL OMNIPAQUE IOHEXOL 350 MG/ML SOLN COMPARISON:  Prior study from 12/05/2020. FINDINGS: CT HEAD Brain: Cerebral volume within normal limits for patient age. No acute intracranial hemorrhage. No acute large vessel  territory infarct. No mass lesion, midline shift, or mass effect. Ventricles are normal in size without hydrocephalus. No extra-axial fluid collection. Vascular: No abnormal hyperdense vessel. Skull: Scalp soft tissues demonstrate no acute abnormality. Calvarium intact. Sinuses/Orbits: Globes and orbital soft tissues within normal limits. Visualized paranasal sinuses are largely clear. No significant mastoid effusion. CTA HEAD Anterior circulation: Both internal carotid arteries widely patent to the termini without stenosis. A1 segments widely patent. Normal anterior communicating artery complex. Both anterior cerebral arteries widely patent to their distal aspects without stenosis. No M1 stenosis or occlusion. Normal MCA bifurcations. Distal MCA branches well perfused and symmetric. Posterior circulation: Both V4 segments patent to the vertebrobasilar junction without stenosis. Both PICA origins patent and normal. Basilar widely patent to its distal aspect without stenosis. Superior cerebellar arteries patent bilaterally. Both PCAs primarily supplied via the basilar and are well perfused to there distal aspects. Venous sinuses: Patent allowing for timing the contrast bolus. Anatomic variants: None significant.  No aneurysm. Review of the MIP images confirms  the above findings. IMPRESSION: 1. Normal CTA of the head. No large vessel occlusion, hemodynamically significant stenosis, or other acute vascular abnormality. No aneurysm. 2. No other acute intracranial abnormality. Electronically Signed   By: Rise Mu M.D.   On: 04/16/2023 22:34    Procedures Procedures   Medications Ordered in ED Medications  meclizine (ANTIVERT) tablet 25 mg (25 mg Oral Given 04/16/23 2130)  sodium chloride 0.9 % bolus 500 mL (500 mLs Intravenous New Bag/Given 04/16/23 2138)  iohexol (OMNIPAQUE) 350 MG/ML injection 75 mL (75 mLs Intravenous Contrast Given 04/16/23 2113)    ED Course/ Medical Decision Making/ A&P   Medical Decision Making Amount and/or Complexity of Data Reviewed Labs: ordered. Radiology: ordered.  Risk Prescription drug management.   38 year old female presents for evaluation.  Please see HPI for further details.  On examination the patient is afebrile and nontachycardic.  Her lung sounds are clear bilaterally, she is nonhypoxic.  Her abdomen is soft and compressible throughout.  Neurological examination is without focal neurodeficits.  CN III through XII are intact.  Intact finger-nose and heel-to-shin.  No pronator drift, no slurred speech or facial droop.  Suspect patient symptoms secondary to vertigo.  She reports a history of vertigo and states she feels very similarly.  Will provide patient meclizine, fluid bolus.  Will assess patient with CBC, CMP, lipase, CT angio head due to dizziness.  CBC shows a slight leukocytosis 11.3 however patient afebrile and nontachycardic.  Metabolic panel unremarkable without electrolyte derangement.  Lipase WNL.  CTA unremarkable without evidence of LVO.  Patient was reassessed after meclizine.  She reports that her dizziness has decreased but she is still dizzy.  The patient was offered transfer to [facility] for MRI to rule out more sinister causes of dizziness however she is deferring on this  at this time.  She reports would like to go home and reassess in the morning.  She reports that in the past she has taken 24 hours in order for her dizziness to fully subside even with meclizine.  Shared decision-making conversation was had.  The patient was offered transfer to [facility] for MRI to fully rule out sinister causes of dizziness however she is deferring on this at this time.  I gave the patient strict return precautions and she voiced understanding.  Will send patient home with meclizine and Zofran.  Have encouraged her to proceed to [facility] in the morning for dizziness persist and she voiced  understanding.  She had all of her questions answered to her satisfaction.  She is stable to discharge home.   Final Clinical Impression(s) / ED Diagnoses Final diagnoses:  Dizziness  Nausea and vomiting, unspecified vomiting type    Rx / DC Orders ED Discharge Orders          Ordered    meclizine (ANTIVERT) 12.5 MG tablet  3 times daily PRN        04/16/23 2351    ondansetron (ZOFRAN) 4 MG tablet  Every 6 hours        04/16/23 2351              Al Decant, PA-C 04/16/23 2353    Loetta Rough, MD 04/17/23 [phone]

## 2023-04-17 ENCOUNTER — Emergency Department (HOSPITAL_COMMUNITY): Payer: BC Managed Care – PPO

## 2023-04-17 ENCOUNTER — Other Ambulatory Visit: Payer: Self-pay

## 2023-04-17 ENCOUNTER — Encounter (HOSPITAL_COMMUNITY): Payer: Self-pay

## 2023-04-17 ENCOUNTER — Emergency Department (HOSPITAL_COMMUNITY)
Admission: EM | Admit: 2023-04-17 | Discharge: 2023-04-17 | Discharge status: Against Medical Advice | Payer: BC Managed Care – PPO | Attending: Emergency Medicine | Admitting: Emergency Medicine

## 2023-04-17 DIAGNOSIS — R42 Dizziness and giddiness: Secondary | ICD-10-CM | POA: Insufficient documentation

## 2023-04-17 DIAGNOSIS — Z5321 Procedure and treatment not carried out due to patient leaving prior to being seen by health care provider: Secondary | ICD-10-CM | POA: Insufficient documentation

## 2023-04-17 LAB — BASIC METABOLIC PANEL
Anion gap: 12 (ref 5–15)
BUN: 7 mg/dL (ref 6–20)
CO2: 24 mmol/L (ref 22–32)
Calcium: 9.5 mg/dL (ref 8.9–10.3)
Chloride: 103 mmol/L (ref 98–111)
Creatinine, Ser: 0.78 mg/dL (ref 0.44–1.00)
GFR, Estimated: >60 mL/min (ref >60)
Glucose, Bld: 69 mg/dL — ABNORMAL LOW (ref 70–99)
Potassium: 3.5 mmol/L (ref 3.5–5.1)
Sodium: 139 mmol/L (ref 135–145)

## 2023-04-17 LAB — CBC WITH DIFFERENTIAL/PLATELET
Abs Immature Granulocytes: 0.02 10*3/uL (ref 0.00–0.07)
Basophils Absolute: 0 10*3/uL (ref 0.0–0.1)
Basophils Relative: 0 %
Eosinophils Absolute: 0.1 10*3/uL (ref 0.0–0.5)
Eosinophils Relative: 2 %
HCT: 44.8 % (ref 36.0–46.0)
Hemoglobin: 14.9 g/dL (ref 12.0–15.0)
Immature Granulocytes: 0 %
Lymphocytes Relative: 21 %
Lymphs Abs: 1.6 10*3/uL (ref 0.7–4.0)
MCH: 31.6 pg (ref 26.0–34.0)
MCHC: 33.3 g/dL (ref 30.0–36.0)
MCV: 94.9 fL (ref 80.0–100.0)
Monocytes Absolute: 0.6 10*3/uL (ref 0.1–1.0)
Monocytes Relative: 8 %
Neutro Abs: 5.1 10*3/uL (ref 1.7–7.7)
Neutrophils Relative %: 69 %
Platelets: 295 10*3/uL (ref 150–400)
RBC: 4.72 MIL/uL (ref 3.87–5.11)
RDW: 11.9 % (ref 11.5–15.5)
WBC: 7.4 10*3/uL (ref 4.0–10.5)
nRBC: 0 % (ref 0.0–0.2)

## 2023-04-17 LAB — HCG, SERUM, QUALITATIVE: Preg, Serum: NEGATIVE

## 2023-04-17 MED ORDER — ONDANSETRON 4 MG PO TBDP
4.0000 mg | ORAL_TABLET | Freq: Once | ORAL | Status: AC | PRN
  Administered 2023-04-17: 4 mg via ORAL
  Filled 2023-04-17: qty 1

## 2023-04-17 NOTE — ED Triage Notes (Signed)
 Patient was seen at [facility] yesterday for the same symptoms after taking a THC gummie.  Patient was offered transfer to [facility] for MRI but declined.  Patient returns for n/v and dizziness that has continued.

## 2023-04-17 NOTE — ED Provider Triage Note (Signed)
 Emergency Medicine Provider Triage Evaluation Note  Erika Davidson , a 38 y.o. female  was evaluated in triage.  Pt complains of dizziness.  Does have a history of vertigo.  States that Friday night she was having trouble sleeping.  Her husband purchased her THC sleeping supplement.  She woke up Saturday morning dizzy and vomiting.  Went to [facility].  Was offered MRI because the dizziness was persistent but she declined and went home.  States overall she is feeling better but the dizziness has been persistent.  She describes it as a room spinning sensation.  It is improved when she sits still.  Review of Systems  Positive: See above Negative: See above  Physical Exam  BP 106/81 (BP Location: Left Arm)   Pulse 85   Temp 98.4 F (36.9 C) (Oral)   Resp 16   Ht 5\' 6"  (1.676 m)   Wt 71.7 kg   LMP 04/15/2023 (Exact Date)   SpO2 100%   BMI 25.50 kg/m  Gen:   Awake, no distress   Resp:  Normal effort  MSK:   Moves extremities without difficulty  Other:    Medical Decision Making  Medically screening exam initiated at 5:39 PM.  Appropriate orders placed.  Erika Davidson was informed that the remainder of the evaluation will be completed by another provider, this initial triage assessment does not replace that evaluation, and the importance of remaining in the ED until their evaluation is complete.  Work up started   Erika Davidson, [place] 04/17/23 [phone]

## 2023-04-17 NOTE — ED Notes (Signed)
 Pt stating she is feeling worse than when she got her, and is wanting to leave.

## 2023-04-24 NOTE — Addendum Note (Signed)
 Addended by: Guy Begin on: 04/24/2023 04:47 PM   Modules accepted: Orders

## 2023-04-24 NOTE — Telephone Encounter (Signed)
 I called pt and sent over her med list.  She wanted the medrol dosepack left on in case she may need to break her migraine cycle,  zofran she is not taking.  (I could not take it off).  She states that she is taking the tizanidine po at bedtime and is helps.  She notes that will creep back in and later in day will be worse, then will be gone end of day.  She tolderates well, does not make her too sleepy.  Did you want to increase the dose for more effectiveness or Bid dosing?  To see if would help.

## 2023-04-25 NOTE — Telephone Encounter (Signed)
 Ok to increase tizanidine to 4 mg at bedtime.

## 2023-04-26 MED ORDER — TIZANIDINE HCL 2 MG PO TABS: 4.0000 mg | ORAL_TABLET | Freq: Every day | ORAL | 5 refills | Status: DC

## 2023-04-26 NOTE — Addendum Note (Signed)
 Addended by: Guy Begin on: 04/26/2023 03:04 PM   Modules accepted: Orders

## 2023-05-25 MED ORDER — TIZANIDINE HCL 2 MG PO TABS: 4.0000 mg | ORAL_TABLET | Freq: Every day | ORAL | 5 refills | Status: AC

## 2023-05-25 NOTE — Addendum Note (Signed)
 Addended by: Enedina Finner on: 05/25/2023 12:18 PM   Modules accepted: Orders

## 2023-09-26 ENCOUNTER — Telehealth: Payer: BC Managed Care – PPO | Admitting: Adult Health
